# Patient Record
Sex: Female | Born: 1940
Health system: Southern US, Community
[De-identification: ages and names within clinical notes are randomized; demographics above are authoritative.]

## PROBLEM LIST (undated history)

## (undated) DIAGNOSIS — E782 Mixed hyperlipidemia: Secondary | ICD-10-CM

## (undated) DIAGNOSIS — M25569 Pain in unspecified knee: Secondary | ICD-10-CM

## (undated) DIAGNOSIS — M542 Cervicalgia: Secondary | ICD-10-CM

## (undated) DIAGNOSIS — Z860100 Personal history of colon polyps, unspecified: Secondary | ICD-10-CM

## (undated) DIAGNOSIS — I10 Essential (primary) hypertension: Secondary | ICD-10-CM

## (undated) DIAGNOSIS — J309 Allergic rhinitis, unspecified: Secondary | ICD-10-CM

## (undated) DIAGNOSIS — H9311 Tinnitus, right ear: Secondary | ICD-10-CM

## (undated) DIAGNOSIS — M62838 Other muscle spasm: Secondary | ICD-10-CM

## (undated) DIAGNOSIS — I471 Supraventricular tachycardia, unspecified: Secondary | ICD-10-CM

## (undated) DIAGNOSIS — M25549 Pain in joints of unspecified hand: Secondary | ICD-10-CM

## (undated) DIAGNOSIS — K219 Gastro-esophageal reflux disease without esophagitis: Secondary | ICD-10-CM

## (undated) DIAGNOSIS — D869 Sarcoidosis, unspecified: Secondary | ICD-10-CM

## (undated) DIAGNOSIS — E7800 Pure hypercholesterolemia, unspecified: Secondary | ICD-10-CM

## (undated) DIAGNOSIS — M899 Disorder of bone, unspecified: Secondary | ICD-10-CM

## (undated) DIAGNOSIS — E119 Type 2 diabetes mellitus without complications: Secondary | ICD-10-CM

## (undated) DIAGNOSIS — M8580 Other specified disorders of bone density and structure, unspecified site: Secondary | ICD-10-CM

## (undated) DIAGNOSIS — H269 Unspecified cataract: Secondary | ICD-10-CM

## (undated) DIAGNOSIS — R252 Cramp and spasm: Secondary | ICD-10-CM

## (undated) HISTORY — PX: ABDOMINAL HYSTERECTOMY: SHX81

## (undated) HISTORY — DX: Pain in joints of unspecified hand: M25.549

## (undated) HISTORY — PX: ROTATOR CUFF REPAIR: SHX139

## (undated) HISTORY — DX: Personal history of colon polyps, unspecified: Z86.0100

## (undated) HISTORY — DX: Cramp and spasm: R25.2

## (undated) HISTORY — DX: Personal history of colonic polyps: Z86.010

## (undated) HISTORY — DX: Other muscle spasm: M62.838

## (undated) HISTORY — DX: Allergic rhinitis, unspecified: J30.9

## (undated) HISTORY — PX: TRIGGER FINGER RELEASE: SHX641

## (undated) HISTORY — DX: Pure hypercholesterolemia, unspecified: E78.00

## (undated) HISTORY — DX: Other specified disorders of bone density and structure, unspecified site: M85.80

## (undated) HISTORY — DX: Mixed hyperlipidemia: E78.2

## (undated) HISTORY — DX: Supraventricular tachycardia, unspecified: I47.10

## (undated) HISTORY — DX: Pain in unspecified knee: M25.569

## (undated) HISTORY — DX: Disorder of bone, unspecified: M89.9

## (undated) HISTORY — DX: Supraventricular tachycardia: I47.1

## (undated) HISTORY — DX: Tinnitus, right ear: H93.11

## (undated) HISTORY — DX: Unspecified cataract: H26.9

## (undated) HISTORY — DX: Gastro-esophageal reflux disease without esophagitis: K21.9

## (undated) HISTORY — DX: Cervicalgia: M54.2

---

## 1999-02-10 ENCOUNTER — Other Ambulatory Visit: Admission: RE | Admit: 1999-02-10 | Discharge: 1999-02-10 | Payer: Self-pay | Admitting: Obstetrics and Gynecology

## 1999-04-19 ENCOUNTER — Ambulatory Visit (HOSPITAL_COMMUNITY): Admission: RE | Admit: 1999-04-19 | Discharge: 1999-04-20 | Payer: Self-pay | Admitting: Neurosurgery

## 1999-04-28 ENCOUNTER — Ambulatory Visit (HOSPITAL_COMMUNITY): Admission: RE | Admit: 1999-04-28 | Discharge: 1999-04-28 | Payer: Self-pay | Admitting: Neurosurgery

## 1999-05-10 ENCOUNTER — Encounter: Admission: RE | Admit: 1999-05-10 | Discharge: 1999-05-10 | Payer: Self-pay | Admitting: Neurosurgery

## 1999-06-17 ENCOUNTER — Encounter: Admission: RE | Admit: 1999-06-17 | Discharge: 1999-06-17 | Payer: Self-pay | Admitting: Neurosurgery

## 1999-09-03 ENCOUNTER — Encounter: Admission: RE | Admit: 1999-09-03 | Discharge: 1999-09-03 | Payer: Self-pay | Admitting: *Deleted

## 1999-09-03 ENCOUNTER — Encounter: Payer: Self-pay | Admitting: *Deleted

## 1999-09-15 ENCOUNTER — Encounter: Admission: RE | Admit: 1999-09-15 | Discharge: 1999-09-15 | Payer: Self-pay | Admitting: Neurosurgery

## 2000-12-06 ENCOUNTER — Encounter: Admission: RE | Admit: 2000-12-06 | Discharge: 2000-12-06 | Payer: Self-pay | Admitting: Neurosurgery

## 2001-04-12 ENCOUNTER — Encounter: Admission: RE | Admit: 2001-04-12 | Discharge: 2001-04-12 | Payer: Self-pay | Admitting: Internal Medicine

## 2001-04-12 ENCOUNTER — Encounter: Payer: Self-pay | Admitting: Internal Medicine

## 2001-06-26 ENCOUNTER — Encounter: Payer: Self-pay | Admitting: Internal Medicine

## 2001-06-26 ENCOUNTER — Encounter: Admission: RE | Admit: 2001-06-26 | Discharge: 2001-06-26 | Payer: Self-pay | Admitting: Internal Medicine

## 2001-10-23 ENCOUNTER — Encounter: Payer: Self-pay | Admitting: Orthopedic Surgery

## 2001-10-23 ENCOUNTER — Ambulatory Visit (HOSPITAL_COMMUNITY): Admission: RE | Admit: 2001-10-23 | Discharge: 2001-10-23 | Payer: Self-pay | Admitting: Orthopedic Surgery

## 2001-12-24 ENCOUNTER — Encounter: Payer: Self-pay | Admitting: Orthopedic Surgery

## 2001-12-24 ENCOUNTER — Ambulatory Visit (HOSPITAL_COMMUNITY): Admission: RE | Admit: 2001-12-24 | Discharge: 2001-12-24 | Payer: Self-pay | Admitting: Orthopedic Surgery

## 2002-02-12 ENCOUNTER — Ambulatory Visit (HOSPITAL_BASED_OUTPATIENT_CLINIC_OR_DEPARTMENT_OTHER): Admission: RE | Admit: 2002-02-12 | Discharge: 2002-02-12 | Payer: Self-pay | Admitting: Orthopedic Surgery

## 2003-11-25 ENCOUNTER — Ambulatory Visit (HOSPITAL_COMMUNITY): Admission: RE | Admit: 2003-11-25 | Discharge: 2003-11-25 | Payer: Self-pay | Admitting: Orthopedic Surgery

## 2003-11-25 ENCOUNTER — Ambulatory Visit (HOSPITAL_BASED_OUTPATIENT_CLINIC_OR_DEPARTMENT_OTHER): Admission: RE | Admit: 2003-11-25 | Discharge: 2003-11-25 | Payer: Self-pay | Admitting: Orthopedic Surgery

## 2004-08-10 ENCOUNTER — Encounter: Admission: RE | Admit: 2004-08-10 | Discharge: 2004-08-10 | Payer: Self-pay | Admitting: Internal Medicine

## 2004-08-11 ENCOUNTER — Other Ambulatory Visit: Admission: RE | Admit: 2004-08-11 | Discharge: 2004-08-11 | Payer: Self-pay | Admitting: Obstetrics and Gynecology

## 2004-10-06 ENCOUNTER — Encounter: Admission: RE | Admit: 2004-10-06 | Discharge: 2004-10-06 | Payer: Self-pay | Admitting: Internal Medicine

## 2006-01-19 ENCOUNTER — Ambulatory Visit (HOSPITAL_BASED_OUTPATIENT_CLINIC_OR_DEPARTMENT_OTHER): Admission: RE | Admit: 2006-01-19 | Discharge: 2006-01-19 | Payer: Self-pay | Admitting: Orthopedic Surgery

## 2006-05-23 ENCOUNTER — Encounter (INDEPENDENT_AMBULATORY_CARE_PROVIDER_SITE_OTHER): Payer: Self-pay | Admitting: *Deleted

## 2006-05-23 ENCOUNTER — Ambulatory Visit (HOSPITAL_BASED_OUTPATIENT_CLINIC_OR_DEPARTMENT_OTHER): Admission: RE | Admit: 2006-05-23 | Discharge: 2006-05-23 | Payer: Self-pay | Admitting: Orthopedic Surgery

## 2006-07-13 ENCOUNTER — Other Ambulatory Visit: Admission: RE | Admit: 2006-07-13 | Discharge: 2006-07-13 | Payer: Self-pay | Admitting: Obstetrics and Gynecology

## 2008-08-12 ENCOUNTER — Encounter: Admission: RE | Admit: 2008-08-12 | Discharge: 2008-08-12 | Payer: Self-pay | Admitting: Internal Medicine

## 2008-08-28 ENCOUNTER — Encounter: Payer: Self-pay | Admitting: Obstetrics and Gynecology

## 2008-08-28 ENCOUNTER — Other Ambulatory Visit: Admission: RE | Admit: 2008-08-28 | Discharge: 2008-08-28 | Payer: Self-pay | Admitting: Obstetrics and Gynecology

## 2008-08-28 ENCOUNTER — Ambulatory Visit: Payer: Self-pay | Admitting: Obstetrics and Gynecology

## 2008-09-09 ENCOUNTER — Ambulatory Visit: Payer: Self-pay | Admitting: Obstetrics and Gynecology

## 2009-11-17 ENCOUNTER — Ambulatory Visit (HOSPITAL_BASED_OUTPATIENT_CLINIC_OR_DEPARTMENT_OTHER): Admission: RE | Admit: 2009-11-17 | Discharge: 2009-11-17 | Payer: Self-pay | Admitting: Orthopedic Surgery

## 2010-02-14 ENCOUNTER — Emergency Department (HOSPITAL_BASED_OUTPATIENT_CLINIC_OR_DEPARTMENT_OTHER): Admission: EM | Admit: 2010-02-14 | Discharge: 2010-02-14 | Payer: Self-pay | Admitting: Emergency Medicine

## 2010-10-19 LAB — POCT HEMOGLOBIN-HEMACUE: Hemoglobin: 13.9 g/dL (ref 12.0–15.0)

## 2010-10-19 LAB — BASIC METABOLIC PANEL
BUN: 12 mg/dL (ref 6–23)
CO2: 28 mEq/L (ref 19–32)
Calcium: 8.9 mg/dL (ref 8.4–10.5)
Chloride: 104 mEq/L (ref 96–112)
Creatinine, Ser: 0.92 mg/dL (ref 0.4–1.2)
GFR calc Af Amer: >60 mL/min (ref >60)
GFR calc non Af Amer: >60 mL/min (ref >60)
Glucose, Bld: 117 mg/dL — ABNORMAL HIGH (ref 70–99)
Potassium: 3.7 mEq/L (ref 3.5–5.1)
Sodium: 140 mEq/L (ref 135–145)

## 2010-10-19 LAB — GLUCOSE, CAPILLARY
Glucose-Capillary: 112 mg/dL — ABNORMAL HIGH (ref 70–99)
Glucose-Capillary: 86 mg/dL (ref 70–99)

## 2010-12-17 NOTE — Op Note (Signed)
[name], [name]              ACCOUNT NO.:  [id number]   MEDICAL RECORD NO.:  [id number]          PATIENT TYPE:  AMB   LOCATION:  DSC                          FACILITY:  [facility]   PHYSICIAN:  Katy Fitch. Sypher, M.D. DATE OF BIRTH:  12/14/1940   DATE OF PROCEDURE:  05/23/2006  DATE OF DISCHARGE:                                 OPERATIVE REPORT   PREOPERATIVE DIAGNOSIS:  Complex chronic tenosynovitis left ring finger  status post release of A1 pulley 4 months prior with a residual crepitation  and locking of profundus tendon.   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   OPERATION:  Palmar fasciectomy, left ring finger, followed by  tenosynovectomy of the superficialis and profundus flexor tendons left ring  finger.   OPERATIONS:  [name], M.D.   ASSISTANT:  Renee Maduro Dasnoit PA-C   ANESTHESIA:  Is 2% lidocaine palmar block supplemented by IV sedation,  supervising anesthesiologist is [name].   INDICATIONS:  [name] is a 70 year old woman who is employed as a  Occupational psychologist for [employer].   I have known her for more than 10 years.  She has had a history of stenosing  tenosynovitis and other orthopedic predicaments affecting her upper  extremities.   Four months prior she underwent an uncomplicated release of her left ring  finger A1 pulley.   She had a 49-month history of stenosing tenosynovitis prior to the surgery  and had a very prolonged approvals process to obtain her surgical  __________.   At the time of her prior surgery.  A simple release of her A1 pulley was  accomplished.   Initial 2 months following surgery she had recovery full range of motion,  however, approximately 2 months after surgery she began to  experience  recurrent pain in her palm and had a crepitation with full flexion of the  finger.  She developed a recurrent locking.  Her differential diagnosis  included palmar fibromatosis causing recurrent stenosing tenosynovitis first  as partial flexor tendon injury versus a nodule in the tendon.  Due to  failure to respond after 2 months of nonoperative management, she is brought  to the operating room at this time for re-exploration of her flexor sheath.   PROCEDURE:  [name] is brought to operating room and placed supine  position on the table.   After anesthesia consult by [name], monitored anesthesia care was  advised.  [name] was transferred to room 6, placed in supine position on  the table and IV sedation provided.  The left arm was prepped with Betadine  soap solution, sterilely draped.  Pneumatic tourniquet applied to proximal  left brachium.   2% lidocaine was infiltrated along the path the common digital nerves to the  ring finger.  When anesthesia satisfactory the arm was scrubbed with  Betadine soap solution, sterilely draped.  After exsanguination of the left  arm with  Esmarch bandage, arterial tourniquet inflated to 240 mmHg.   Procedure commenced with a Brunner zigzag incision exposing the flexor  sheath from approximately 1 cm above the A1 pulley to the base of the A2  pulley distally.   The subcutaneous issues were carefully divided revealing a mass of keloid  type scar involving the pre tendinous fibers of the ring finger and the  adjacent septa.   With great care the common digital artery and nerve to the long and ring  finger and ring and small finger were dissected from this fibrous mass and a  complete fasciectomy accomplished.  The flexor sheath was studied.  The A1  pulley had partially reformed but was not particularly tense.  The reformed  A1 pulley was incised and the flexor tendons delivered.  There was a  considerable cuff of  tenosynovium that developed that was quite fibrotic  between the superficialis and profundus tendons.   The tendons were individually delivered and a complete tenosynovectomy was  accomplished.   Thereafter free range of motion the finger was recovered.   [name] was awakened from sedation and demonstrated full active range of  motion without residual crepitation or triggering.  There was no sign of  flexor tendon injury.  It appeared that the primary pathology was a palmar  fibromatosis, secondary pathology is secondary tenosynovitis.   The wound was then repaired with intradermal 3-0 Prolene suture.  A  compressive dressing was applied with Xeroflo, sterile gauze, and Ace wrap.   For aftercare [name] was advised to elevate her hand for 4 days.  She  will begin immediate range of motion exercises.  She will be discharged with  prescription for Vicodin 5 mg one p.o. q.4-6 hours p.r.n. pain 20 tablets  without refill.  She return to office for follow-up in 5 days for dressing  change and suture removal in approximately 8-9 days.      Katy Fitch Sypher, M.D.  Electronically Signed     RVS/MEDQ  D:  05/23/2006  T:  05/24/2006  Job:  [id number]   cc:   Ladell Pier, M.D.

## 2010-12-17 NOTE — Op Note (Signed)
[facility]  Patient:    AKELIA, HUSTED Visit Number: [id number] MRN: [id number]          Service Type: DSU Location: [facility] Attending Physician:  Susa Day Dictated by:   Katy Fitch Naaman Plummer., M.D. Proc. Date: 02/12/02 Admit Date:  02/12/2002   CC:         Barbette Hair. Vaughan Basta., M.D.  Stacie Glaze, M.D. [facility]  Hewitt Shorts, M.D.   Operative Report  PREOPERATIVE DIAGNOSIS:  Chronic left arm and wrist pain dating back to February 2003, status post extensive evaluation by Drs. Farris Has of sports medicine, Dr. Newell Coral neurosurgery, Dr. Coral Spikes rheumatology and the upper extremity physicians at the [facility] including myself and Dr. Johna Roles.  PREOPERATIVE DIAGNOSIS:  Probable synovitis of left ulnocarpal joint with positive bone scan revealing increased uptake in the region of the distal radioulnar joint and ulnocarpal joint, rule out triangular fibrocartilage tear versus inflammatory arthropathy.  POSTOPERATIVE DIAGNOSIS:  Moderate synovitis without evidence of triangular fibrocartilage tear.  OPERATION PERFORMED:  Diagnostic arthroscopy of the left radiocarpal and ulnocarpal joints with ulnocarpal and dorsal radiocarpal synovectomy.  SURGEON:  Katy Fitch. Sypher, Montez Hageman., M.D.  ASSISTANT:  Jonni Sanger, P.A.  ANESTHESIA:  Axillary block.  SUPERVISING ANESTHESIOLOGIST:  Dr. Gypsy Balsam.  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  She presented for evaluation and management of a painful left upper extremity in February 2003.  She is a long term patient of our office who has had previous carpal tunnel release in 1994.  She was initially evaluated by Dr. Farris Has at [facility] for pain on the dorsal aspect and ulnar aspect of her left wrist.  Dr. Farris Has felt that she probably had  a tenosynovitis and placed her in a wrist splint on Celebrex.  She was referred to see Lillia Dallas. Murray Hodgkins, M.D. for electrodiagnostic studies.  She was noted to have prolonged latencies on the left; however, she is status post left carpal tunnel release in 1994 and in my judgment I believe that these were chronic changes following her previous entrapment neuropathy.  At the time of her evaluation in February 2003, my impression was that she likely had had a left fourth dorsal compartment stenosing synovitis and was advised to continue to splint and use Celebrex.  She returned in March 2003 with persistent discomfort despite splinting and anti-inflammatory medication.  She began to experience numbness in her ulnar enervated fingers.  She was seen by Dr. Johna Roles for detailed electrodiagnostic studies.  These were completed on October 01, 2001.  She was noted to have normal bilateral ulnar sensory studies with normal amplitudes and F-waves.  We were unable to make a diagnosis of entrapment neuropathy of the ulnar nerve.  We continued to treat her symptomatically and requested an evaluation by her neurosurgical physicians including an MRI of her cervical spine.  The plain films of her cervical spine obtained in our office revealed a satisfactory C5-6 anterior cervical fusion completed by Dr. Lovell Sheehan 2000 with an anterior plate fixation system.  Her fusion appeared to be solid.  She had significant C4-5 degenerative disk disease with uncovertebral hypertrophy and foraminal compromise.  An MRI of her cervical spine was obtained on October 23, 2001 and was interpreted by the radiologist to reveal satisfactory fusion of the C5-6 disk, no abnormality at the C7-T1 that would  account for possible C8 root symptoms on the left.  She had moderate spondylosis at C4-5 with osteophyte encroachment upon the ventral subarachnoid space and both neural foramina at C4-5.  There was left-sided uncovertebral  disease at C2-3 and C3-4 with some minimal foraminal narrowing.  There was minimal spondylosis at C6-7 without stenosis.  The MRI was nondiagnostic for a source of left arm pain.  We continued to treat Ms. Decarolis through April and May with Neurontin as well as other medications to try to manage her pain.  I requested that she return to see Dr. Coral Spikes at [facility] for rheumatology consultation.  Dr. Coral Spikes completed a comprehensive evaluation on Dec 25, 2001.  Ms. Chiles had a history of sarcoidosis and Dr. Fayrene Fearing evaluation and management was designed to rule out an inflammatory source and/or a sarcoidosis source of her continued arm pain.  An extremely thorough evaluation by Dr. Coral Spikes revealed "left outer wrist pain radiating up left forearm, question etiology."  Dr. Coral Spikes could not identify a rheumatologic source of her discomfort and did not believe that her sarcoidosis was an issue in her pain at this time.  Dr. Coral Spikes returned her to the [facility] for further evaluation of her wrist predicament.  Clinical examination began to reveal more focused symptoms on the ulnar aspect of her wrist.  She was referred for a bone scan of her left wrist on Dec 24, 2001, which was interpreted by Dr. Kearney Hard, radiologist, to reveal increased activity in the region of the left distal radioulnar joint on both the blood pool and delayed images consistent with triangular fibrocartilage tear and reactive changes in the region of the distal radioulnar joint and ulnocarpal joint.  After two more months of observation, Ms. Grandmaison continued to experience ulnar sided wrist pain and crepitation with translation of her ulnocarpal joint. Therefore, we recommended proceeding with diagnostic arthroscopy of her wrist at this time.   In brief summary, Ms. Talmadge has had a relatively relentless pain predicament in her left arm that has defied neurosurgical, orthopedic and  rheumatological explanation todate.  The goal of this procedure was to identify a source of her pain.  DESCRIPTION OF PROCEDURE:  Maelynn Moroney was brought to the operating room and placed in supine position on the operating table.  Following axillary block in the holding area, anesthesia was satisfactory in the left arm.  The left arm was prepped with Betadine soap and solution and sterilely draped. One gram of Ancef was administered as an IV prophylactic antibiotic.  Ten pounds of traction was applied to the left wrist with finger traps on the index and long fingers and counter traction on the forearm.  The arthroscope was introduced after distention of the wrist joint with 5 cc of sterile saline with blunt technique through a 3-4 dorsal portal. Diagnostic arthroscopy revealed abundant soft tissue on the dorsal aspect of the radiolunate articulation which obscured easy visualization of the triangular fibrocartilage.  The scope was carefully advanced to the region of the triangular fibrocartilage.  The hyaline articular cartilage on the triquetrum, lunate and scaphoid was normal.  The lunatotriquetral interosseous ligament and the scapholunate interosseous ligament were both noted to be normal.  The triangular fibrocartilage was intact.  There was 2+ synovitis at the ulnocarpal recess.  The pisotriquetral joint was not visualized.  There was a normal prestyloid recess.  A 6-R portal was created with an 18 gauge needle followed by use of a hemostat to enlarge the portal to  accept a 2.9 mm suction shaver.  A full radius resector was used to perform a synovectomy in the region of the prestyloid recess.  No other pathology was identified.  No loose bodies were noted. There was no sign of a peripheral or central triangular fibrocartilage tear.  The scope was then removed from the 3-4 dorsal portal, placed in the 6-R portal and used to visualize in a radial direction.  There was  abundant synovitis noted on the dorsal aspect of the lunate and some capsular thickening.  A synovectomy was performed on the dorsal aspect of the LT ligament, lunate and across the dorsal surface of the proximal pole of the scaphoid.  I could not identify an interosseous or intracapsular ganglion and I did not find evidence of significant radiocarpal pathology.  The ligamentous structures visualized appeared normal.  After completion of the synovectomy I elected to discontinue the arthroscopy.  The plain films did not suggest any midcarpal pathology and clinical exam suggested no midcarpal pathology.  In my experience there are more complications of midcarpal arthroscopy than would warrant in my judgment arthroscopy of this joint at this time; therefore, this was not included in this diagnostic procedure.  The arthroscopic equipment was removed and the portals repaired with interrupted sutures of 5-0 nylon.  My final diagnostic impression was that there was moderate synovitis within the wrist joint deep to the fourth dorsal compartment and this could be a response to repetitive work and may be the source of Ms. Schippers pain.  There were no signs of significant internal derangement other than the synovitis.  For aftercare, Ms. Kott was placed in a well-padded volar splint with the wrist in 5 degrees dorsiflexion.  Ace wrap was used for light compression. She was awakened from sedation and transferred to recovery room with stable vital signs.  She will return to our office for follow-up evaluation in five days to begin a rehabilitation program.  For aftercare she is given prescriptions for Percocet 5 mg one to two tablets p.o. q.4-6h. p.r.n. pain, 20 tablets without refill. Also Keflex 500 mg 1 p.o. q.8h. times four days as a prophylactic antibiotic. Dictated by:   Katy Fitch Naaman Plummer., M.D. Attending Physician:  Susa Day DD:  02/12/02 TD:  02/13/02 Job:  [id number]

## 2010-12-17 NOTE — Op Note (Signed)
NAME:  [name], [name]                        ACCOUNT NO.:  [id number]   MEDICAL RECORD NO.:  [id number]                   PATIENT TYPE:  AMB   LOCATION:  DSC                                  FACILITY:  [facility]   PHYSICIAN:  Katy Fitch. Naaman Plummer., M.D.          DATE OF BIRTH:  01-Jul-1941   DATE OF PROCEDURE:  11/25/2003  DATE OF DISCHARGE:                                 OPERATIVE REPORT   PREOPERATIVE DIAGNOSIS:  Chronic painful stenosing tenosynovitis, right ring  finger at A1 pulley.   POSTOPERATIVE DIAGNOSIS:  Chronic painful stenosing tenosynovitis, right  ring finger at A1 pulley.   PROCEDURE:  Release of right ring finger A1 pulley and careful inspection of  flexor tendons.  No synovial biopsy was obtained as there was no significant  tenosynovitis noted.   SURGEON:  Katy Fitch. Sypher, M.D.   ASSISTANT:  Jonni Sanger, P.A.   ANESTHESIA:  General by LMA.   SUPERVISING ANESTHESIOLOGIST:  Janetta Hora. Gelene Mink, M.D.   INDICATIONS FOR PROCEDURE:  The patient is a 69 year old woman referred by  Dr. Lennox Pippins for evaluation and management of a painful locking right  ring finger.   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   She filed a Facilities manager and was ultimately successful  achieving benefits for this predicament.   Due to a failure to respond to nonoperative measures she is brought to the  operating room at this time for release of her right ring finger A1 pulley.   Due to her history of sarcoidosis, we were anticipating a possible synovial  biopsy.   DESCRIPTION OF PROCEDURE:  The patient is brought to the operating room and  placed in the supine position on the operating table.  Following induction  of general anesthesia by LMA, the right arm was prepped with Betadine soap  and solution and sterilely draped.   Following exsanguination of the limb with an  Esmarch bandage, an arterial  tourniquet on the proximal brachium was inflated to 220 mmHg.  The procedure  commenced with a short oblique incision directly over the A1 pulley.  The  subcutaneous tissues were carefully divided revealing the flexor sheath.  The neurovascular bundles were gently retracted.  The sheath was not  distended; rather the pulley appeared to be contracted.   The A1 pulley was released along its radial border followed by delivery of  the flexor tendons.  There were some steroid crystal residuals within the  superficialis tendon.  There was no sign of significant tendosynovitis.   There was no significant A0 pulley.   Therefore, free range of motion of the finger was recovered.   The wound was then irrigated and repaired with interrupted suture of 5-0  nylon.   For after care, the patient is given a prescription for Darvocet-N 100 one  p.o. q.4  to 6h p.r.n. pain, #20 tablets without refill.                                               Katy Fitch Naaman Plummer., M.D.    RVS/MEDQ  D:  11/25/2003  T:  11/25/2003  Job:  [id number]   cc:   Demetria Pore. Coral Spikes, M.D.  [address]  [address]  [place]  [place] [address]  Fax: [phone]

## 2010-12-17 NOTE — Op Note (Signed)
[name], VENEZIA              ACCOUNT NO.:  [id number]   MEDICAL RECORD NO.:  [id number]          PATIENT TYPE:  AMB   LOCATION:  DSC                          FACILITY:  [facility]   PHYSICIAN:  Katy Fitch. Sypher, M.D. DATE OF BIRTH:  07-01-1941   DATE OF PROCEDURE:  01/19/2006  DATE OF DISCHARGE:                                 OPERATIVE REPORT   PREOPERATIVE DIAGNOSIS:  Chronic stenosing tenosynovitis, left ring finger,  A1 pulley.   POSTOPERATIVE DIAGNOSIS:  Chronic stenosing tenosynovitis, left ring finger,  A1 pulley.   OPERATION:  Release of left ring finger, A1 pulley.   OPERATIONS:  Josephine Igo, M.D.   ASSISTANT:  Annye Rusk, P.A.-C.   ANESTHESIA:  Two percent lidocaine metacarpal head level block, left ring  finger, supplemented by IV sedation, supervising anesthesiologist Dr. Gypsy Balsam.   INDICATIONS:  Ms. Natahsa Marian is a 70 year old woman, self-referred for  evaluation management of a locking left ring trigger finger.  She has had  past experience of trigger fingers and carpal tunnel syndrome.  Having  failed prior injection therapy, she requested we proceed directly to release  of the A1 pulley.   After informed consent, she was brought to the operating room at this time.   PROCEDURE:  Ms. Kalliopi Coupland was brought to the operating room and placed  in the supine position on the table.   Following light sedation, left arm was prepped with Betadine soap solution  and sterilely draped.  Following examination of the limb with Esmarch  bandage, an arterial tourniquet on the proximal brachium was inflated to 220  mmHg.  Procedure commenced with short incision, directly over the palpably  thickened A1 pulley.  Subcutaneous tissues were carefully divided revealing  the palmar fascia.  This was split longitudinally to reveal the flexor  sheath.  The ____________ fibers of the ring finger were released with  scissors followed by isolation of A1 pulley.  The pulley  was released along  its radial border.  The tendons were delivered.  No masses or other  predicaments were noted.  This was small A0 pulley proximally that was  released with scissors as well.   The wound was then repaired with mattress suture of 5-0 nylon.   A compressive dressing was applied with Xeroflo sterile gauze and Ace wrap.  There were no apparent complications.      Katy Fitch Sypher, M.D.  Electronically Signed     RVS/MEDQ  D:  01/19/2006  T:  01/19/2006  Job:  [id number]   cc:   Ladell Pier, M.D.  Fax: [phone]

## 2011-03-31 ENCOUNTER — Encounter (HOSPITAL_BASED_OUTPATIENT_CLINIC_OR_DEPARTMENT_OTHER)
Admission: RE | Admit: 2011-03-31 | Discharge: 2011-03-31 | Disposition: A | Discharge status: Home / Self Care | Payer: Medicare Other | Source: Ambulatory Visit | Attending: Orthopedic Surgery | Admitting: Orthopedic Surgery

## 2011-03-31 LAB — BASIC METABOLIC PANEL
BUN: 17 mg/dL (ref 6–23)
Chloride: 103 mEq/L (ref 96–112)
Creatinine, Ser: 0.81 mg/dL (ref 0.50–1.10)
GFR calc Af Amer: >60 mL/min (ref >60)
GFR calc non Af Amer: >60 mL/min (ref >60)

## 2011-04-05 ENCOUNTER — Ambulatory Visit (HOSPITAL_BASED_OUTPATIENT_CLINIC_OR_DEPARTMENT_OTHER)
Admission: RE | Admit: 2011-04-05 | Discharge: 2011-04-05 | Disposition: A | Discharge status: Home / Self Care | Payer: Medicare Other | Source: Ambulatory Visit | Attending: Orthopedic Surgery | Admitting: Orthopedic Surgery

## 2011-04-05 DIAGNOSIS — I10 Essential (primary) hypertension: Secondary | ICD-10-CM | POA: Insufficient documentation

## 2011-04-05 DIAGNOSIS — E119 Type 2 diabetes mellitus without complications: Secondary | ICD-10-CM | POA: Insufficient documentation

## 2011-04-05 DIAGNOSIS — M75110 Incomplete rotator cuff tear or rupture of unspecified shoulder, not specified as traumatic: Secondary | ICD-10-CM | POA: Insufficient documentation

## 2011-04-05 DIAGNOSIS — Z5333 Arthroscopic surgical procedure converted to open procedure: Secondary | ICD-10-CM | POA: Insufficient documentation

## 2011-04-05 DIAGNOSIS — Z01810 Encounter for preprocedural cardiovascular examination: Secondary | ICD-10-CM | POA: Insufficient documentation

## 2011-04-05 DIAGNOSIS — M7530 Calcific tendinitis of unspecified shoulder: Secondary | ICD-10-CM | POA: Insufficient documentation

## 2011-04-05 DIAGNOSIS — K219 Gastro-esophageal reflux disease without esophagitis: Secondary | ICD-10-CM | POA: Insufficient documentation

## 2011-04-05 DIAGNOSIS — M19019 Primary osteoarthritis, unspecified shoulder: Secondary | ICD-10-CM | POA: Insufficient documentation

## 2011-04-05 LAB — GLUCOSE, CAPILLARY: Glucose-Capillary: 106 mg/dL — ABNORMAL HIGH (ref 70–99)

## 2011-04-14 NOTE — Op Note (Signed)
[name], [name]              ACCOUNT NO.:  [id number]  MEDICAL RECORD NO.:  [id number]  LOCATION:                                 FACILITY:  PHYSICIAN:  Katy Fitch. Hailea Eaglin, M.D. DATE OF BIRTH:  [birth year]  DATE OF PROCEDURE:  04/05/2011 DATE OF DISCHARGE:                              OPERATIVE REPORT   PREOPERATIVE DIAGNOSES: 1. Chronic acromioclavicular degenerative arthritis, right shoulder. 2. Calcific tendinopathy, right shoulder. 3. MRI documented, near complete rotator cuff tear of supraspinatus     thought to be a 80% plus partial articular surface tendon avulsion     lesion.  POSTOPERATIVE DIAGNOSES:  68% plus partial articular surface tendon avulsion lesion of supraspinatus rotator cuff tendon, unfavorable acromioclavicular anatomy and unfavorable acromial anatomy.  OPERATIONS: 1. Examination of right shoulder under anesthesia confirming the     absence of adhesive capsulitis and a stable glenohumeral joint. 2. Diagnostic arthroscopy, right glenohumeral joint confirming 90%     partial articular surface tendon avulsion tear of supraspinatus     rotator cuff tendon. 3. Arthroscopic subacromial decompression with acromioplasty,     coracoacromial ligament release and bursectomy. 4. Arthroscopic distal clavicle resection. 5. Open reconstruction of supraspinatus rotator cuff tendon.  OPERATING SURGEON:  Katy Fitch. Cristabel Bicknell, MD  ASSISTANT:  Jonni Sanger, PA-C  ANESTHESIA:  General endotracheal.  SUPERVISING ANESTHESIOLOGIST:  Achille Rich, MD  Ms. Helminiak also had a supplemental ropivacaine plexus block for perioperative analgesia.  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  We have been acquainted in a working relationship for more than 10 years.  We have treated her for a number orthopedic predicaments of the upper extremity.  In the late spring of 2012, she presented for evaluation of  significant right shoulder pain. Exam was compatible with impingement, AC arthropathy, and a probable rotator cuff tear.  Eventually, we obtained an MRI of her shoulder, which documented a significant PASTA articular surface tear of the supraspinous rotator cuff tendon as well as unfavorable AC anatomy and an unfavorable acromial anatomy.  We advised Ms. Asano when her pain became intolerable to proceed with diagnostic arthroscopy, subacromial decompression, distal clavicle resection, and repair of rotator cuff. She is brought to the operating room at this time anticipating decompression, distal clavicle resection, and repair of her supraspinatus rotator cuff tendon.  Preoperatively, she was interviewed by Dr. Chaney Malling of Anesthesia who recommended general anesthesia by endotracheal technique.  This was supplemented by perioperative plexus block, placed without complication in the holding area utilizing ropivacaine.  Questions were invited and answered in detail in the holding area.  PROCEDURE:  [name] is brought to room 2 of the [facility] and placed in supine position on the operating table.  Following the induction of general endotracheal anesthesia under Dr. Seward Meth direct supervision, she was carefully positioned in the beach- chair position with torso and headholder designed for shoulder arthroscopy.  Examination of the right shoulder under anesthesia revealed combined elevation 170, external rotation 90, internal rotation 70.  She had no sign of glenohumeral instability.  The right upper extremity was prepped with DuraPrep and draped with impervious arthroscopy drapes.  Passive compression devices were applied to the calves and 1 g of Ancef was administered as an IV prophylactic antibiotic.  Following a routine surgical time-out, we proceeded to instrument the shoulder with a switching stick placed anteriorly with through-and- through technique to  place the scope through a standard posterior viewing portal.  Diagnostic arthroscopy confirmed intact hyaline articular cartilage surfaces on the glenoid and humeral head.  The superior recesses and inferior recess were both noted be normal.  The long head of the biceps had a stable origin at the superior glenoid. The biceps was normal through the rotator interval.  The subscapularis was normal.  The supraspinatus had a 90% PASTA lesion with degenerative tendon exposing the greater tuberosity.  The infraspinatus and teres minor were normal.  After photographic documentation of the pathology and normal anatomy, we proceeded with instrumentation of the tear by placing a spinal needle and use of a 4.2-mm suction shaver to debride the tendon.  Given the complexity of repairing these well with arthroscopic technique, I ultimately elected to perform a hybrid open repair through a very small incision.  We then removed the arthroscope from the glenohumeral joint and placed in the subacromial space.  Florid bursitis was cleared with a suction shaver followed by release of coracoacromial ligament.  The acromion was leveled to a type 1 morphology with significant removal of an anterior osteophyte and lateral osteophyte.  The capsule of the [facility] joint was photographed.  The capsule remnants removed with suction shaver and bipolar cautery.  Hemostasis was achieved with bipolar cautery throughout the bursa.  The suction bur was then used to level the acromion at the [facility] joint and to perform further lateral and anterior acromioplasty.  The distal centimeter  of clavicle was removed arthroscopically with hemostasis with bipolar cautery.  The bursal side of the tendon was quite rough due to abrasion deep to the coracoacromial ligament and anterior osteophytes and the AC osteophytes.  We then noted the proper approach with a spinal needle to the repair site followed by performing a 2-cm incision with  anterior middle third deltoid splitting, bursectomy, incision through the bursal surface of the cuff confirming the 90% degenerative tear off of the greater tuberosity.  A power bur was used to decorticate the footprint of the supraspinatus followed by placement of a vented [employer] corkscrew medially followed by placement of two mattress sutures creating anatomic footprint at the articular margin.  The tails of the suture was used to invaginate the edges of the repair into an anatomic footprint followed by use of the lateral SwiveLock with all four suture tails compressing the repair in a most satisfactory manner.  The scope was  replaced in glenohumeral joint confirming that the long head of the biceps was not filed with the suture and the footprint of the repair was anatomic.  After irrigation of the joint and subacromial space, we repaired the muscle split with figure-of-eight mattress sutures of 0 Vicryl followed by repair of the skin with subcutaneous 0 Vicryl, 2-0 Vicryl, and intradermal 3-0 Prolene.  There  were no apparent complications.  Ms. Mohammad tolerated the surgery and anesthesia well.  She was placed in a sling, awakened from general anesthesia and transferred to the recovery room with stable vital signs.  For aftercare, she will be monitored in the recovery care center with serial examination of her cutaneous blood glucose and appropriate analgesics in the form of p.o. and IV Dilaudid.  She will be provided Ancef 1 g IV  q.8 h. x3 doses.     Katy Fitch Keondre Markson, M.D.     RVS/MEDQ  D:  04/05/2011  T:  04/06/2011  Job:  [id number]  cc:   Deirdre Peer. Polite, M.D.  Electronically Signed by Josephine Igo M.D. on 04/14/2011 08:40:54 AM

## 2011-08-03 DIAGNOSIS — M75120 Complete rotator cuff tear or rupture of unspecified shoulder, not specified as traumatic: Secondary | ICD-10-CM | POA: Diagnosis not present

## 2011-08-03 DIAGNOSIS — M67919 Unspecified disorder of synovium and tendon, unspecified shoulder: Secondary | ICD-10-CM | POA: Diagnosis not present

## 2011-08-03 DIAGNOSIS — M19019 Primary osteoarthritis, unspecified shoulder: Secondary | ICD-10-CM | POA: Diagnosis not present

## 2011-08-03 DIAGNOSIS — M719 Bursopathy, unspecified: Secondary | ICD-10-CM | POA: Diagnosis not present

## 2011-08-08 DIAGNOSIS — M75120 Complete rotator cuff tear or rupture of unspecified shoulder, not specified as traumatic: Secondary | ICD-10-CM | POA: Diagnosis not present

## 2011-08-08 DIAGNOSIS — M719 Bursopathy, unspecified: Secondary | ICD-10-CM | POA: Diagnosis not present

## 2011-08-08 DIAGNOSIS — M19019 Primary osteoarthritis, unspecified shoulder: Secondary | ICD-10-CM | POA: Diagnosis not present

## 2011-08-08 DIAGNOSIS — M67919 Unspecified disorder of synovium and tendon, unspecified shoulder: Secondary | ICD-10-CM | POA: Diagnosis not present

## 2011-08-09 DIAGNOSIS — M19019 Primary osteoarthritis, unspecified shoulder: Secondary | ICD-10-CM | POA: Diagnosis not present

## 2011-08-09 DIAGNOSIS — M67919 Unspecified disorder of synovium and tendon, unspecified shoulder: Secondary | ICD-10-CM | POA: Diagnosis not present

## 2011-08-09 DIAGNOSIS — M719 Bursopathy, unspecified: Secondary | ICD-10-CM | POA: Diagnosis not present

## 2011-08-09 DIAGNOSIS — M75120 Complete rotator cuff tear or rupture of unspecified shoulder, not specified as traumatic: Secondary | ICD-10-CM | POA: Diagnosis not present

## 2011-08-17 DIAGNOSIS — M719 Bursopathy, unspecified: Secondary | ICD-10-CM | POA: Diagnosis not present

## 2011-08-17 DIAGNOSIS — M67919 Unspecified disorder of synovium and tendon, unspecified shoulder: Secondary | ICD-10-CM | POA: Diagnosis not present

## 2011-08-17 DIAGNOSIS — M19019 Primary osteoarthritis, unspecified shoulder: Secondary | ICD-10-CM | POA: Diagnosis not present

## 2011-08-17 DIAGNOSIS — M75120 Complete rotator cuff tear or rupture of unspecified shoulder, not specified as traumatic: Secondary | ICD-10-CM | POA: Diagnosis not present

## 2011-08-23 DIAGNOSIS — M19019 Primary osteoarthritis, unspecified shoulder: Secondary | ICD-10-CM | POA: Diagnosis not present

## 2011-08-23 DIAGNOSIS — M75120 Complete rotator cuff tear or rupture of unspecified shoulder, not specified as traumatic: Secondary | ICD-10-CM | POA: Diagnosis not present

## 2011-08-23 DIAGNOSIS — M67919 Unspecified disorder of synovium and tendon, unspecified shoulder: Secondary | ICD-10-CM | POA: Diagnosis not present

## 2011-08-30 DIAGNOSIS — M19019 Primary osteoarthritis, unspecified shoulder: Secondary | ICD-10-CM | POA: Diagnosis not present

## 2011-08-30 DIAGNOSIS — M719 Bursopathy, unspecified: Secondary | ICD-10-CM | POA: Diagnosis not present

## 2011-08-30 DIAGNOSIS — M75120 Complete rotator cuff tear or rupture of unspecified shoulder, not specified as traumatic: Secondary | ICD-10-CM | POA: Diagnosis not present

## 2011-08-30 DIAGNOSIS — M67919 Unspecified disorder of synovium and tendon, unspecified shoulder: Secondary | ICD-10-CM | POA: Diagnosis not present

## 2011-09-05 DIAGNOSIS — M67919 Unspecified disorder of synovium and tendon, unspecified shoulder: Secondary | ICD-10-CM | POA: Diagnosis not present

## 2011-09-05 DIAGNOSIS — M75120 Complete rotator cuff tear or rupture of unspecified shoulder, not specified as traumatic: Secondary | ICD-10-CM | POA: Diagnosis not present

## 2011-09-05 DIAGNOSIS — M19019 Primary osteoarthritis, unspecified shoulder: Secondary | ICD-10-CM | POA: Diagnosis not present

## 2011-09-13 DIAGNOSIS — M542 Cervicalgia: Secondary | ICD-10-CM | POA: Diagnosis not present

## 2011-09-14 DIAGNOSIS — M75120 Complete rotator cuff tear or rupture of unspecified shoulder, not specified as traumatic: Secondary | ICD-10-CM | POA: Diagnosis not present

## 2011-09-14 DIAGNOSIS — M19019 Primary osteoarthritis, unspecified shoulder: Secondary | ICD-10-CM | POA: Diagnosis not present

## 2011-09-14 DIAGNOSIS — M719 Bursopathy, unspecified: Secondary | ICD-10-CM | POA: Diagnosis not present

## 2011-09-14 DIAGNOSIS — M67919 Unspecified disorder of synovium and tendon, unspecified shoulder: Secondary | ICD-10-CM | POA: Diagnosis not present

## 2011-09-20 DIAGNOSIS — M545 Low back pain, unspecified: Secondary | ICD-10-CM | POA: Diagnosis not present

## 2011-09-20 DIAGNOSIS — M25519 Pain in unspecified shoulder: Secondary | ICD-10-CM | POA: Diagnosis not present

## 2011-09-21 DIAGNOSIS — M67919 Unspecified disorder of synovium and tendon, unspecified shoulder: Secondary | ICD-10-CM | POA: Diagnosis not present

## 2011-09-21 DIAGNOSIS — M75120 Complete rotator cuff tear or rupture of unspecified shoulder, not specified as traumatic: Secondary | ICD-10-CM | POA: Diagnosis not present

## 2011-09-21 DIAGNOSIS — M719 Bursopathy, unspecified: Secondary | ICD-10-CM | POA: Diagnosis not present

## 2011-09-21 DIAGNOSIS — M19019 Primary osteoarthritis, unspecified shoulder: Secondary | ICD-10-CM | POA: Diagnosis not present

## 2011-09-27 DIAGNOSIS — M67919 Unspecified disorder of synovium and tendon, unspecified shoulder: Secondary | ICD-10-CM | POA: Diagnosis not present

## 2011-09-27 DIAGNOSIS — M75120 Complete rotator cuff tear or rupture of unspecified shoulder, not specified as traumatic: Secondary | ICD-10-CM | POA: Diagnosis not present

## 2011-09-27 DIAGNOSIS — M19019 Primary osteoarthritis, unspecified shoulder: Secondary | ICD-10-CM | POA: Diagnosis not present

## 2011-09-30 ENCOUNTER — Other Ambulatory Visit: Payer: Self-pay | Admitting: Neurosurgery

## 2011-09-30 DIAGNOSIS — M542 Cervicalgia: Secondary | ICD-10-CM

## 2011-10-04 DIAGNOSIS — M75120 Complete rotator cuff tear or rupture of unspecified shoulder, not specified as traumatic: Secondary | ICD-10-CM | POA: Diagnosis not present

## 2011-10-06 ENCOUNTER — Ambulatory Visit
Admission: RE | Admit: 2011-10-06 | Discharge: 2011-10-06 | Disposition: A | Discharge status: Home / Self Care | Payer: Medicare Other | Source: Ambulatory Visit | Attending: Neurosurgery | Admitting: Neurosurgery

## 2011-10-06 DIAGNOSIS — M5030 Other cervical disc degeneration, unspecified cervical region: Secondary | ICD-10-CM | POA: Diagnosis not present

## 2011-10-06 DIAGNOSIS — M47812 Spondylosis without myelopathy or radiculopathy, cervical region: Secondary | ICD-10-CM | POA: Diagnosis not present

## 2011-10-06 DIAGNOSIS — M542 Cervicalgia: Secondary | ICD-10-CM

## 2011-10-06 DIAGNOSIS — M5020 Other cervical disc displacement, unspecified cervical region: Secondary | ICD-10-CM | POA: Diagnosis not present

## 2011-10-07 DIAGNOSIS — E782 Mixed hyperlipidemia: Secondary | ICD-10-CM | POA: Diagnosis not present

## 2011-10-07 DIAGNOSIS — E1129 Type 2 diabetes mellitus with other diabetic kidney complication: Secondary | ICD-10-CM | POA: Diagnosis not present

## 2011-10-07 DIAGNOSIS — N182 Chronic kidney disease, stage 2 (mild): Secondary | ICD-10-CM | POA: Diagnosis not present

## 2011-10-07 DIAGNOSIS — I10 Essential (primary) hypertension: Secondary | ICD-10-CM | POA: Diagnosis not present

## 2011-10-11 DIAGNOSIS — M719 Bursopathy, unspecified: Secondary | ICD-10-CM | POA: Diagnosis not present

## 2011-10-11 DIAGNOSIS — M67919 Unspecified disorder of synovium and tendon, unspecified shoulder: Secondary | ICD-10-CM | POA: Diagnosis not present

## 2011-10-11 DIAGNOSIS — M75120 Complete rotator cuff tear or rupture of unspecified shoulder, not specified as traumatic: Secondary | ICD-10-CM | POA: Diagnosis not present

## 2011-10-17 DIAGNOSIS — M19019 Primary osteoarthritis, unspecified shoulder: Secondary | ICD-10-CM | POA: Diagnosis not present

## 2011-10-17 DIAGNOSIS — M24119 Other articular cartilage disorders, unspecified shoulder: Secondary | ICD-10-CM | POA: Diagnosis not present

## 2011-10-17 DIAGNOSIS — M67919 Unspecified disorder of synovium and tendon, unspecified shoulder: Secondary | ICD-10-CM | POA: Diagnosis not present

## 2011-10-24 DIAGNOSIS — M719 Bursopathy, unspecified: Secondary | ICD-10-CM | POA: Diagnosis not present

## 2011-10-24 DIAGNOSIS — M67919 Unspecified disorder of synovium and tendon, unspecified shoulder: Secondary | ICD-10-CM | POA: Diagnosis not present

## 2011-10-24 DIAGNOSIS — M19019 Primary osteoarthritis, unspecified shoulder: Secondary | ICD-10-CM | POA: Diagnosis not present

## 2011-11-01 DIAGNOSIS — M24119 Other articular cartilage disorders, unspecified shoulder: Secondary | ICD-10-CM | POA: Diagnosis not present

## 2011-11-01 DIAGNOSIS — M719 Bursopathy, unspecified: Secondary | ICD-10-CM | POA: Diagnosis not present

## 2011-11-01 DIAGNOSIS — M19019 Primary osteoarthritis, unspecified shoulder: Secondary | ICD-10-CM | POA: Diagnosis not present

## 2011-11-01 DIAGNOSIS — M67919 Unspecified disorder of synovium and tendon, unspecified shoulder: Secondary | ICD-10-CM | POA: Diagnosis not present

## 2011-11-07 DIAGNOSIS — R21 Rash and other nonspecific skin eruption: Secondary | ICD-10-CM | POA: Diagnosis not present

## 2011-11-07 DIAGNOSIS — E782 Mixed hyperlipidemia: Secondary | ICD-10-CM | POA: Diagnosis not present

## 2011-11-08 DIAGNOSIS — M719 Bursopathy, unspecified: Secondary | ICD-10-CM | POA: Diagnosis not present

## 2011-11-08 DIAGNOSIS — M19019 Primary osteoarthritis, unspecified shoulder: Secondary | ICD-10-CM | POA: Diagnosis not present

## 2011-11-08 DIAGNOSIS — M24119 Other articular cartilage disorders, unspecified shoulder: Secondary | ICD-10-CM | POA: Diagnosis not present

## 2011-11-08 DIAGNOSIS — M67919 Unspecified disorder of synovium and tendon, unspecified shoulder: Secondary | ICD-10-CM | POA: Diagnosis not present

## 2011-11-14 DIAGNOSIS — M67919 Unspecified disorder of synovium and tendon, unspecified shoulder: Secondary | ICD-10-CM | POA: Diagnosis not present

## 2011-11-14 DIAGNOSIS — M719 Bursopathy, unspecified: Secondary | ICD-10-CM | POA: Diagnosis not present

## 2011-11-30 ENCOUNTER — Encounter (INDEPENDENT_AMBULATORY_CARE_PROVIDER_SITE_OTHER): Payer: Medicare Other | Admitting: Ophthalmology

## 2011-11-30 DIAGNOSIS — E11319 Type 2 diabetes mellitus with unspecified diabetic retinopathy without macular edema: Secondary | ICD-10-CM

## 2011-11-30 DIAGNOSIS — H35039 Hypertensive retinopathy, unspecified eye: Secondary | ICD-10-CM | POA: Diagnosis not present

## 2011-11-30 DIAGNOSIS — H4310 Vitreous hemorrhage, unspecified eye: Secondary | ICD-10-CM | POA: Diagnosis not present

## 2011-11-30 DIAGNOSIS — E1165 Type 2 diabetes mellitus with hyperglycemia: Secondary | ICD-10-CM

## 2011-11-30 DIAGNOSIS — H3560 Retinal hemorrhage, unspecified eye: Secondary | ICD-10-CM | POA: Diagnosis not present

## 2011-11-30 DIAGNOSIS — I10 Essential (primary) hypertension: Secondary | ICD-10-CM

## 2011-11-30 DIAGNOSIS — H43819 Vitreous degeneration, unspecified eye: Secondary | ICD-10-CM | POA: Diagnosis not present

## 2011-11-30 DIAGNOSIS — E1139 Type 2 diabetes mellitus with other diabetic ophthalmic complication: Secondary | ICD-10-CM

## 2011-12-02 DIAGNOSIS — M542 Cervicalgia: Secondary | ICD-10-CM | POA: Diagnosis not present

## 2011-12-02 DIAGNOSIS — M545 Low back pain, unspecified: Secondary | ICD-10-CM | POA: Diagnosis not present

## 2012-01-09 DIAGNOSIS — H2510 Age-related nuclear cataract, unspecified eye: Secondary | ICD-10-CM | POA: Diagnosis not present

## 2012-01-11 ENCOUNTER — Encounter (INDEPENDENT_AMBULATORY_CARE_PROVIDER_SITE_OTHER): Payer: Medicare Other | Admitting: Ophthalmology

## 2012-01-11 DIAGNOSIS — H43819 Vitreous degeneration, unspecified eye: Secondary | ICD-10-CM | POA: Diagnosis not present

## 2012-01-11 DIAGNOSIS — H35039 Hypertensive retinopathy, unspecified eye: Secondary | ICD-10-CM

## 2012-01-11 DIAGNOSIS — E11319 Type 2 diabetes mellitus with unspecified diabetic retinopathy without macular edema: Secondary | ICD-10-CM

## 2012-01-11 DIAGNOSIS — E1139 Type 2 diabetes mellitus with other diabetic ophthalmic complication: Secondary | ICD-10-CM

## 2012-01-11 DIAGNOSIS — I10 Essential (primary) hypertension: Secondary | ICD-10-CM

## 2012-01-11 DIAGNOSIS — H3560 Retinal hemorrhage, unspecified eye: Secondary | ICD-10-CM

## 2012-01-11 DIAGNOSIS — H2510 Age-related nuclear cataract, unspecified eye: Secondary | ICD-10-CM

## 2012-02-03 DIAGNOSIS — E119 Type 2 diabetes mellitus without complications: Secondary | ICD-10-CM | POA: Diagnosis not present

## 2012-02-03 DIAGNOSIS — E782 Mixed hyperlipidemia: Secondary | ICD-10-CM | POA: Diagnosis not present

## 2012-02-03 DIAGNOSIS — I10 Essential (primary) hypertension: Secondary | ICD-10-CM | POA: Diagnosis not present

## 2012-03-15 DIAGNOSIS — L270 Generalized skin eruption due to drugs and medicaments taken internally: Secondary | ICD-10-CM | POA: Diagnosis not present

## 2012-03-21 DIAGNOSIS — S63639A Sprain of interphalangeal joint of unspecified finger, initial encounter: Secondary | ICD-10-CM | POA: Diagnosis not present

## 2012-03-28 DIAGNOSIS — N289 Disorder of kidney and ureter, unspecified: Secondary | ICD-10-CM | POA: Diagnosis not present

## 2012-03-28 DIAGNOSIS — S63639A Sprain of interphalangeal joint of unspecified finger, initial encounter: Secondary | ICD-10-CM | POA: Diagnosis not present

## 2012-04-04 DIAGNOSIS — S63639A Sprain of interphalangeal joint of unspecified finger, initial encounter: Secondary | ICD-10-CM | POA: Diagnosis not present

## 2012-04-04 DIAGNOSIS — M109 Gout, unspecified: Secondary | ICD-10-CM | POA: Diagnosis not present

## 2012-04-13 DIAGNOSIS — S63639A Sprain of interphalangeal joint of unspecified finger, initial encounter: Secondary | ICD-10-CM | POA: Diagnosis not present

## 2012-06-08 DIAGNOSIS — N182 Chronic kidney disease, stage 2 (mild): Secondary | ICD-10-CM | POA: Diagnosis not present

## 2012-06-08 DIAGNOSIS — E782 Mixed hyperlipidemia: Secondary | ICD-10-CM | POA: Diagnosis not present

## 2012-06-08 DIAGNOSIS — I10 Essential (primary) hypertension: Secondary | ICD-10-CM | POA: Diagnosis not present

## 2012-06-08 DIAGNOSIS — E1129 Type 2 diabetes mellitus with other diabetic kidney complication: Secondary | ICD-10-CM | POA: Diagnosis not present

## 2012-06-14 DIAGNOSIS — Z1231 Encounter for screening mammogram for malignant neoplasm of breast: Secondary | ICD-10-CM | POA: Diagnosis not present

## 2012-06-14 DIAGNOSIS — Z803 Family history of malignant neoplasm of breast: Secondary | ICD-10-CM | POA: Diagnosis not present

## 2012-06-20 DIAGNOSIS — N6489 Other specified disorders of breast: Secondary | ICD-10-CM | POA: Diagnosis not present

## 2012-07-12 ENCOUNTER — Ambulatory Visit (INDEPENDENT_AMBULATORY_CARE_PROVIDER_SITE_OTHER): Payer: Medicare Other | Admitting: Ophthalmology

## 2012-07-23 ENCOUNTER — Ambulatory Visit (INDEPENDENT_AMBULATORY_CARE_PROVIDER_SITE_OTHER): Payer: Medicare Other | Admitting: Ophthalmology

## 2012-07-23 DIAGNOSIS — H35039 Hypertensive retinopathy, unspecified eye: Secondary | ICD-10-CM | POA: Diagnosis not present

## 2012-07-23 DIAGNOSIS — J069 Acute upper respiratory infection, unspecified: Secondary | ICD-10-CM | POA: Diagnosis not present

## 2012-07-23 DIAGNOSIS — E1165 Type 2 diabetes mellitus with hyperglycemia: Secondary | ICD-10-CM

## 2012-07-23 DIAGNOSIS — E11319 Type 2 diabetes mellitus with unspecified diabetic retinopathy without macular edema: Secondary | ICD-10-CM

## 2012-07-23 DIAGNOSIS — I10 Essential (primary) hypertension: Secondary | ICD-10-CM

## 2012-07-23 DIAGNOSIS — D869 Sarcoidosis, unspecified: Secondary | ICD-10-CM | POA: Diagnosis not present

## 2012-07-23 DIAGNOSIS — E1139 Type 2 diabetes mellitus with other diabetic ophthalmic complication: Secondary | ICD-10-CM

## 2012-07-23 DIAGNOSIS — H43819 Vitreous degeneration, unspecified eye: Secondary | ICD-10-CM

## 2012-07-23 DIAGNOSIS — R238 Other skin changes: Secondary | ICD-10-CM | POA: Diagnosis not present

## 2012-07-23 DIAGNOSIS — H2510 Age-related nuclear cataract, unspecified eye: Secondary | ICD-10-CM

## 2012-11-09 DIAGNOSIS — E1129 Type 2 diabetes mellitus with other diabetic kidney complication: Secondary | ICD-10-CM | POA: Diagnosis not present

## 2012-11-09 DIAGNOSIS — E785 Hyperlipidemia, unspecified: Secondary | ICD-10-CM | POA: Diagnosis not present

## 2012-11-09 DIAGNOSIS — R809 Proteinuria, unspecified: Secondary | ICD-10-CM | POA: Diagnosis not present

## 2012-11-09 DIAGNOSIS — I10 Essential (primary) hypertension: Secondary | ICD-10-CM | POA: Diagnosis not present

## 2013-01-17 DIAGNOSIS — H2510 Age-related nuclear cataract, unspecified eye: Secondary | ICD-10-CM | POA: Diagnosis not present

## 2013-03-08 DIAGNOSIS — E782 Mixed hyperlipidemia: Secondary | ICD-10-CM | POA: Diagnosis not present

## 2013-03-08 DIAGNOSIS — I10 Essential (primary) hypertension: Secondary | ICD-10-CM | POA: Diagnosis not present

## 2013-03-08 DIAGNOSIS — E1129 Type 2 diabetes mellitus with other diabetic kidney complication: Secondary | ICD-10-CM | POA: Diagnosis not present

## 2013-03-08 DIAGNOSIS — R259 Unspecified abnormal involuntary movements: Secondary | ICD-10-CM | POA: Diagnosis not present

## 2013-04-18 DIAGNOSIS — H2510 Age-related nuclear cataract, unspecified eye: Secondary | ICD-10-CM | POA: Diagnosis not present

## 2013-04-18 DIAGNOSIS — E119 Type 2 diabetes mellitus without complications: Secondary | ICD-10-CM | POA: Diagnosis not present

## 2013-05-13 DIAGNOSIS — H2510 Age-related nuclear cataract, unspecified eye: Secondary | ICD-10-CM | POA: Diagnosis not present

## 2013-06-03 DIAGNOSIS — H269 Unspecified cataract: Secondary | ICD-10-CM | POA: Diagnosis not present

## 2013-06-03 DIAGNOSIS — H2510 Age-related nuclear cataract, unspecified eye: Secondary | ICD-10-CM | POA: Diagnosis not present

## 2013-06-03 DIAGNOSIS — H2589 Other age-related cataract: Secondary | ICD-10-CM | POA: Diagnosis not present

## 2013-06-10 DIAGNOSIS — Z780 Asymptomatic menopausal state: Secondary | ICD-10-CM | POA: Diagnosis not present

## 2013-06-10 DIAGNOSIS — Z1231 Encounter for screening mammogram for malignant neoplasm of breast: Secondary | ICD-10-CM | POA: Diagnosis not present

## 2013-07-05 DIAGNOSIS — M899 Disorder of bone, unspecified: Secondary | ICD-10-CM | POA: Diagnosis not present

## 2013-07-05 DIAGNOSIS — Z23 Encounter for immunization: Secondary | ICD-10-CM | POA: Diagnosis not present

## 2013-07-05 DIAGNOSIS — I10 Essential (primary) hypertension: Secondary | ICD-10-CM | POA: Diagnosis not present

## 2013-07-05 DIAGNOSIS — E119 Type 2 diabetes mellitus without complications: Secondary | ICD-10-CM | POA: Diagnosis not present

## 2013-07-05 DIAGNOSIS — E785 Hyperlipidemia, unspecified: Secondary | ICD-10-CM | POA: Diagnosis not present

## 2013-08-23 DIAGNOSIS — H01029 Squamous blepharitis unspecified eye, unspecified eyelid: Secondary | ICD-10-CM | POA: Diagnosis not present

## 2013-08-23 DIAGNOSIS — H5710 Ocular pain, unspecified eye: Secondary | ICD-10-CM | POA: Diagnosis not present

## 2013-08-23 DIAGNOSIS — T1500XA Foreign body in cornea, unspecified eye, initial encounter: Secondary | ICD-10-CM | POA: Diagnosis not present

## 2013-09-06 DIAGNOSIS — R42 Dizziness and giddiness: Secondary | ICD-10-CM | POA: Diagnosis not present

## 2013-09-09 DIAGNOSIS — H04129 Dry eye syndrome of unspecified lacrimal gland: Secondary | ICD-10-CM | POA: Diagnosis not present

## 2013-09-10 DIAGNOSIS — R42 Dizziness and giddiness: Secondary | ICD-10-CM | POA: Diagnosis not present

## 2013-09-10 DIAGNOSIS — H5789 Other specified disorders of eye and adnexa: Secondary | ICD-10-CM | POA: Diagnosis not present

## 2013-11-22 DIAGNOSIS — I10 Essential (primary) hypertension: Secondary | ICD-10-CM | POA: Diagnosis not present

## 2013-11-22 DIAGNOSIS — E119 Type 2 diabetes mellitus without complications: Secondary | ICD-10-CM | POA: Diagnosis not present

## 2013-11-22 DIAGNOSIS — M899 Disorder of bone, unspecified: Secondary | ICD-10-CM | POA: Diagnosis not present

## 2013-11-22 DIAGNOSIS — E782 Mixed hyperlipidemia: Secondary | ICD-10-CM | POA: Diagnosis not present

## 2014-01-23 DIAGNOSIS — H2510 Age-related nuclear cataract, unspecified eye: Secondary | ICD-10-CM | POA: Diagnosis not present

## 2014-01-23 DIAGNOSIS — E119 Type 2 diabetes mellitus without complications: Secondary | ICD-10-CM | POA: Diagnosis not present

## 2014-05-09 DIAGNOSIS — Z23 Encounter for immunization: Secondary | ICD-10-CM | POA: Diagnosis not present

## 2014-05-09 DIAGNOSIS — Z1389 Encounter for screening for other disorder: Secondary | ICD-10-CM | POA: Diagnosis not present

## 2014-05-09 DIAGNOSIS — E119 Type 2 diabetes mellitus without complications: Secondary | ICD-10-CM | POA: Diagnosis not present

## 2014-05-09 DIAGNOSIS — D860 Sarcoidosis of lung: Secondary | ICD-10-CM | POA: Diagnosis not present

## 2014-05-09 DIAGNOSIS — J309 Allergic rhinitis, unspecified: Secondary | ICD-10-CM | POA: Diagnosis not present

## 2014-05-09 DIAGNOSIS — Z0000 Encounter for general adult medical examination without abnormal findings: Secondary | ICD-10-CM | POA: Diagnosis not present

## 2014-05-09 DIAGNOSIS — I10 Essential (primary) hypertension: Secondary | ICD-10-CM | POA: Diagnosis not present

## 2014-05-16 DIAGNOSIS — Z1211 Encounter for screening for malignant neoplasm of colon: Secondary | ICD-10-CM | POA: Diagnosis not present

## 2014-08-14 DIAGNOSIS — H524 Presbyopia: Secondary | ICD-10-CM | POA: Diagnosis not present

## 2014-08-14 DIAGNOSIS — H2512 Age-related nuclear cataract, left eye: Secondary | ICD-10-CM | POA: Diagnosis not present

## 2014-08-14 DIAGNOSIS — H26491 Other secondary cataract, right eye: Secondary | ICD-10-CM | POA: Diagnosis not present

## 2014-08-14 DIAGNOSIS — E119 Type 2 diabetes mellitus without complications: Secondary | ICD-10-CM | POA: Diagnosis not present

## 2014-11-07 DIAGNOSIS — E139 Other specified diabetes mellitus without complications: Secondary | ICD-10-CM | POA: Diagnosis not present

## 2014-11-07 DIAGNOSIS — E785 Hyperlipidemia, unspecified: Secondary | ICD-10-CM | POA: Diagnosis not present

## 2014-11-07 DIAGNOSIS — I10 Essential (primary) hypertension: Secondary | ICD-10-CM | POA: Diagnosis not present

## 2014-11-07 DIAGNOSIS — M899 Disorder of bone, unspecified: Secondary | ICD-10-CM | POA: Diagnosis not present

## 2015-05-11 DIAGNOSIS — Z0000 Encounter for general adult medical examination without abnormal findings: Secondary | ICD-10-CM | POA: Diagnosis not present

## 2015-05-11 DIAGNOSIS — I10 Essential (primary) hypertension: Secondary | ICD-10-CM | POA: Diagnosis not present

## 2015-05-11 DIAGNOSIS — M899 Disorder of bone, unspecified: Secondary | ICD-10-CM | POA: Diagnosis not present

## 2015-05-11 DIAGNOSIS — Z1389 Encounter for screening for other disorder: Secondary | ICD-10-CM | POA: Diagnosis not present

## 2015-05-11 DIAGNOSIS — D860 Sarcoidosis of lung: Secondary | ICD-10-CM | POA: Diagnosis not present

## 2015-05-11 DIAGNOSIS — E139 Other specified diabetes mellitus without complications: Secondary | ICD-10-CM | POA: Diagnosis not present

## 2015-05-11 DIAGNOSIS — E785 Hyperlipidemia, unspecified: Secondary | ICD-10-CM | POA: Diagnosis not present

## 2015-05-11 DIAGNOSIS — Z23 Encounter for immunization: Secondary | ICD-10-CM | POA: Diagnosis not present

## 2015-05-22 DIAGNOSIS — Z1231 Encounter for screening mammogram for malignant neoplasm of breast: Secondary | ICD-10-CM | POA: Diagnosis not present

## 2015-05-22 DIAGNOSIS — Z803 Family history of malignant neoplasm of breast: Secondary | ICD-10-CM | POA: Diagnosis not present

## 2015-11-17 DIAGNOSIS — E139 Other specified diabetes mellitus without complications: Secondary | ICD-10-CM | POA: Diagnosis not present

## 2015-11-17 DIAGNOSIS — E7800 Pure hypercholesterolemia, unspecified: Secondary | ICD-10-CM | POA: Diagnosis not present

## 2015-11-17 DIAGNOSIS — I10 Essential (primary) hypertension: Secondary | ICD-10-CM | POA: Diagnosis not present

## 2016-03-10 DIAGNOSIS — H524 Presbyopia: Secondary | ICD-10-CM | POA: Diagnosis not present

## 2016-03-10 DIAGNOSIS — H26491 Other secondary cataract, right eye: Secondary | ICD-10-CM | POA: Diagnosis not present

## 2016-03-10 DIAGNOSIS — H25812 Combined forms of age-related cataract, left eye: Secondary | ICD-10-CM | POA: Diagnosis not present

## 2016-05-19 DIAGNOSIS — Z1389 Encounter for screening for other disorder: Secondary | ICD-10-CM | POA: Diagnosis not present

## 2016-05-19 DIAGNOSIS — D860 Sarcoidosis of lung: Secondary | ICD-10-CM | POA: Diagnosis not present

## 2016-05-19 DIAGNOSIS — Z0000 Encounter for general adult medical examination without abnormal findings: Secondary | ICD-10-CM | POA: Diagnosis not present

## 2016-05-19 DIAGNOSIS — E7800 Pure hypercholesterolemia, unspecified: Secondary | ICD-10-CM | POA: Diagnosis not present

## 2016-05-19 DIAGNOSIS — Z23 Encounter for immunization: Secondary | ICD-10-CM | POA: Diagnosis not present

## 2016-05-19 DIAGNOSIS — E139 Other specified diabetes mellitus without complications: Secondary | ICD-10-CM | POA: Diagnosis not present

## 2016-05-19 DIAGNOSIS — M899 Disorder of bone, unspecified: Secondary | ICD-10-CM | POA: Diagnosis not present

## 2016-05-19 DIAGNOSIS — I10 Essential (primary) hypertension: Secondary | ICD-10-CM | POA: Diagnosis not present

## 2016-05-20 ENCOUNTER — Other Ambulatory Visit: Payer: Self-pay | Admitting: Internal Medicine

## 2016-05-20 DIAGNOSIS — M899 Disorder of bone, unspecified: Secondary | ICD-10-CM

## 2016-05-23 DIAGNOSIS — Z1231 Encounter for screening mammogram for malignant neoplasm of breast: Secondary | ICD-10-CM | POA: Diagnosis not present

## 2016-05-23 DIAGNOSIS — Z803 Family history of malignant neoplasm of breast: Secondary | ICD-10-CM | POA: Diagnosis not present

## 2016-05-23 DIAGNOSIS — M8589 Other specified disorders of bone density and structure, multiple sites: Secondary | ICD-10-CM | POA: Diagnosis not present

## 2016-05-27 DIAGNOSIS — Z1211 Encounter for screening for malignant neoplasm of colon: Secondary | ICD-10-CM | POA: Diagnosis not present

## 2016-08-25 DIAGNOSIS — R0981 Nasal congestion: Secondary | ICD-10-CM | POA: Diagnosis not present

## 2016-08-25 DIAGNOSIS — R509 Fever, unspecified: Secondary | ICD-10-CM | POA: Diagnosis not present

## 2016-09-09 ENCOUNTER — Other Ambulatory Visit: Payer: Self-pay | Admitting: Gastroenterology

## 2016-10-11 ENCOUNTER — Ambulatory Visit (HOSPITAL_COMMUNITY): Admission: RE | Admit: 2016-10-11 | Payer: Medicare Other | Source: Ambulatory Visit | Admitting: Gastroenterology

## 2016-10-11 ENCOUNTER — Encounter (HOSPITAL_COMMUNITY): Admission: RE | Payer: Self-pay | Source: Ambulatory Visit

## 2016-10-11 SURGERY — COLONOSCOPY WITH PROPOFOL
Anesthesia: Monitor Anesthesia Care

## 2016-11-29 DIAGNOSIS — M899 Disorder of bone, unspecified: Secondary | ICD-10-CM | POA: Diagnosis not present

## 2016-11-29 DIAGNOSIS — Z1211 Encounter for screening for malignant neoplasm of colon: Secondary | ICD-10-CM | POA: Diagnosis not present

## 2016-11-29 DIAGNOSIS — E1165 Type 2 diabetes mellitus with hyperglycemia: Secondary | ICD-10-CM | POA: Diagnosis not present

## 2016-11-29 DIAGNOSIS — I10 Essential (primary) hypertension: Secondary | ICD-10-CM | POA: Diagnosis not present

## 2016-11-29 DIAGNOSIS — E119 Type 2 diabetes mellitus without complications: Secondary | ICD-10-CM | POA: Diagnosis not present

## 2016-11-29 DIAGNOSIS — E7800 Pure hypercholesterolemia, unspecified: Secondary | ICD-10-CM | POA: Diagnosis not present

## 2016-12-08 DIAGNOSIS — K219 Gastro-esophageal reflux disease without esophagitis: Secondary | ICD-10-CM | POA: Diagnosis not present

## 2016-12-08 DIAGNOSIS — Z1211 Encounter for screening for malignant neoplasm of colon: Secondary | ICD-10-CM | POA: Diagnosis not present

## 2016-12-22 DIAGNOSIS — Z1211 Encounter for screening for malignant neoplasm of colon: Secondary | ICD-10-CM | POA: Diagnosis not present

## 2016-12-22 DIAGNOSIS — D126 Benign neoplasm of colon, unspecified: Secondary | ICD-10-CM | POA: Diagnosis not present

## 2016-12-22 DIAGNOSIS — D175 Benign lipomatous neoplasm of intra-abdominal organs: Secondary | ICD-10-CM | POA: Diagnosis not present

## 2016-12-22 DIAGNOSIS — K648 Other hemorrhoids: Secondary | ICD-10-CM | POA: Diagnosis not present

## 2016-12-29 DIAGNOSIS — Z1211 Encounter for screening for malignant neoplasm of colon: Secondary | ICD-10-CM | POA: Diagnosis not present

## 2016-12-29 DIAGNOSIS — D126 Benign neoplasm of colon, unspecified: Secondary | ICD-10-CM | POA: Diagnosis not present

## 2017-04-13 DIAGNOSIS — H26491 Other secondary cataract, right eye: Secondary | ICD-10-CM | POA: Diagnosis not present

## 2017-04-13 DIAGNOSIS — H524 Presbyopia: Secondary | ICD-10-CM | POA: Diagnosis not present

## 2017-04-13 DIAGNOSIS — H25812 Combined forms of age-related cataract, left eye: Secondary | ICD-10-CM | POA: Diagnosis not present

## 2017-04-13 DIAGNOSIS — H52223 Regular astigmatism, bilateral: Secondary | ICD-10-CM | POA: Diagnosis not present

## 2017-04-13 DIAGNOSIS — R05 Cough: Secondary | ICD-10-CM | POA: Diagnosis not present

## 2017-04-13 DIAGNOSIS — H43813 Vitreous degeneration, bilateral: Secondary | ICD-10-CM | POA: Diagnosis not present

## 2017-04-13 DIAGNOSIS — H35033 Hypertensive retinopathy, bilateral: Secondary | ICD-10-CM | POA: Diagnosis not present

## 2017-05-26 DIAGNOSIS — Z1231 Encounter for screening mammogram for malignant neoplasm of breast: Secondary | ICD-10-CM | POA: Diagnosis not present

## 2017-05-26 DIAGNOSIS — K219 Gastro-esophageal reflux disease without esophagitis: Secondary | ICD-10-CM | POA: Diagnosis not present

## 2017-05-26 DIAGNOSIS — I10 Essential (primary) hypertension: Secondary | ICD-10-CM | POA: Diagnosis not present

## 2017-05-26 DIAGNOSIS — Z23 Encounter for immunization: Secondary | ICD-10-CM | POA: Diagnosis not present

## 2017-05-26 DIAGNOSIS — Z803 Family history of malignant neoplasm of breast: Secondary | ICD-10-CM | POA: Diagnosis not present

## 2017-05-26 DIAGNOSIS — Z0000 Encounter for general adult medical examination without abnormal findings: Secondary | ICD-10-CM | POA: Diagnosis not present

## 2017-05-26 DIAGNOSIS — D860 Sarcoidosis of lung: Secondary | ICD-10-CM | POA: Diagnosis not present

## 2017-05-26 DIAGNOSIS — Z1389 Encounter for screening for other disorder: Secondary | ICD-10-CM | POA: Diagnosis not present

## 2017-05-26 DIAGNOSIS — E119 Type 2 diabetes mellitus without complications: Secondary | ICD-10-CM | POA: Diagnosis not present

## 2017-05-26 DIAGNOSIS — M899 Disorder of bone, unspecified: Secondary | ICD-10-CM | POA: Diagnosis not present

## 2017-05-26 DIAGNOSIS — E7800 Pure hypercholesterolemia, unspecified: Secondary | ICD-10-CM | POA: Diagnosis not present

## 2017-06-08 DIAGNOSIS — E7800 Pure hypercholesterolemia, unspecified: Secondary | ICD-10-CM | POA: Diagnosis not present

## 2017-06-08 DIAGNOSIS — E119 Type 2 diabetes mellitus without complications: Secondary | ICD-10-CM | POA: Diagnosis not present

## 2017-06-08 DIAGNOSIS — I10 Essential (primary) hypertension: Secondary | ICD-10-CM | POA: Diagnosis not present

## 2017-07-13 DIAGNOSIS — H9311 Tinnitus, right ear: Secondary | ICD-10-CM | POA: Diagnosis not present

## 2017-07-18 DIAGNOSIS — H905 Unspecified sensorineural hearing loss: Secondary | ICD-10-CM | POA: Diagnosis not present

## 2017-07-18 DIAGNOSIS — H9041 Sensorineural hearing loss, unilateral, right ear, with unrestricted hearing on the contralateral side: Secondary | ICD-10-CM | POA: Diagnosis not present

## 2017-07-18 DIAGNOSIS — H9311 Tinnitus, right ear: Secondary | ICD-10-CM | POA: Diagnosis not present

## 2017-08-04 ENCOUNTER — Other Ambulatory Visit: Payer: Self-pay | Admitting: Otolaryngology

## 2017-08-04 DIAGNOSIS — H903 Sensorineural hearing loss, bilateral: Secondary | ICD-10-CM

## 2017-08-04 DIAGNOSIS — H905 Unspecified sensorineural hearing loss: Secondary | ICD-10-CM

## 2017-08-08 DIAGNOSIS — J040 Acute laryngitis: Secondary | ICD-10-CM | POA: Diagnosis not present

## 2017-08-08 DIAGNOSIS — H9041 Sensorineural hearing loss, unilateral, right ear, with unrestricted hearing on the contralateral side: Secondary | ICD-10-CM | POA: Diagnosis not present

## 2017-08-08 DIAGNOSIS — J320 Chronic maxillary sinusitis: Secondary | ICD-10-CM | POA: Diagnosis not present

## 2017-08-08 DIAGNOSIS — J039 Acute tonsillitis, unspecified: Secondary | ICD-10-CM | POA: Diagnosis not present

## 2017-08-08 DIAGNOSIS — J322 Chronic ethmoidal sinusitis: Secondary | ICD-10-CM | POA: Diagnosis not present

## 2017-08-08 DIAGNOSIS — H9311 Tinnitus, right ear: Secondary | ICD-10-CM | POA: Diagnosis not present

## 2017-08-16 ENCOUNTER — Other Ambulatory Visit (HOSPITAL_COMMUNITY): Payer: Self-pay | Admitting: Otolaryngology

## 2017-08-16 DIAGNOSIS — J322 Chronic ethmoidal sinusitis: Secondary | ICD-10-CM | POA: Diagnosis not present

## 2017-08-16 DIAGNOSIS — H903 Sensorineural hearing loss, bilateral: Secondary | ICD-10-CM | POA: Diagnosis not present

## 2017-08-16 DIAGNOSIS — H9311 Tinnitus, right ear: Secondary | ICD-10-CM | POA: Diagnosis not present

## 2017-08-16 DIAGNOSIS — J320 Chronic maxillary sinusitis: Secondary | ICD-10-CM | POA: Diagnosis not present

## 2017-08-17 ENCOUNTER — Other Ambulatory Visit (HOSPITAL_COMMUNITY): Payer: Self-pay | Admitting: Otolaryngology

## 2017-08-17 DIAGNOSIS — H9311 Tinnitus, right ear: Secondary | ICD-10-CM

## 2017-08-22 ENCOUNTER — Encounter (HOSPITAL_COMMUNITY): Payer: Self-pay | Admitting: Radiology

## 2017-08-22 ENCOUNTER — Ambulatory Visit (HOSPITAL_COMMUNITY)
Admission: RE | Admit: 2017-08-22 | Discharge: 2017-08-22 | Disposition: A | Discharge status: Home / Self Care | Payer: Medicare Other | Source: Ambulatory Visit | Attending: Otolaryngology | Admitting: Otolaryngology

## 2017-08-22 DIAGNOSIS — I6782 Cerebral ischemia: Secondary | ICD-10-CM | POA: Diagnosis not present

## 2017-08-22 DIAGNOSIS — H9191 Unspecified hearing loss, right ear: Secondary | ICD-10-CM | POA: Diagnosis not present

## 2017-08-22 DIAGNOSIS — H9311 Tinnitus, right ear: Secondary | ICD-10-CM | POA: Diagnosis present

## 2017-08-22 LAB — POCT I-STAT CREATININE: Creatinine, Ser: 1 mg/dL (ref 0.44–1.00)

## 2017-08-22 MED ORDER — GADOBENATE DIMEGLUMINE 529 MG/ML IV SOLN
15.0000 mL | Freq: Once | INTRAVENOUS | Status: AC | PRN
  Administered 2017-08-22: 14 mL via INTRAVENOUS

## 2017-08-30 DIAGNOSIS — J322 Chronic ethmoidal sinusitis: Secondary | ICD-10-CM | POA: Diagnosis not present

## 2017-08-30 DIAGNOSIS — J040 Acute laryngitis: Secondary | ICD-10-CM | POA: Diagnosis not present

## 2017-08-30 DIAGNOSIS — J320 Chronic maxillary sinusitis: Secondary | ICD-10-CM | POA: Diagnosis not present

## 2017-11-22 DIAGNOSIS — E119 Type 2 diabetes mellitus without complications: Secondary | ICD-10-CM | POA: Diagnosis not present

## 2017-11-22 DIAGNOSIS — E7800 Pure hypercholesterolemia, unspecified: Secondary | ICD-10-CM | POA: Diagnosis not present

## 2017-11-22 DIAGNOSIS — I10 Essential (primary) hypertension: Secondary | ICD-10-CM | POA: Diagnosis not present

## 2017-11-22 DIAGNOSIS — F4320 Adjustment disorder, unspecified: Secondary | ICD-10-CM | POA: Diagnosis not present

## 2017-12-11 DIAGNOSIS — R05 Cough: Secondary | ICD-10-CM | POA: Diagnosis not present

## 2018-03-05 ENCOUNTER — Other Ambulatory Visit: Payer: Self-pay | Admitting: Internal Medicine

## 2018-03-05 ENCOUNTER — Ambulatory Visit
Admission: RE | Admit: 2018-03-05 | Discharge: 2018-03-05 | Disposition: A | Discharge status: Home / Self Care | Payer: Medicare Other | Source: Ambulatory Visit | Attending: Internal Medicine | Admitting: Internal Medicine

## 2018-03-05 DIAGNOSIS — R197 Diarrhea, unspecified: Secondary | ICD-10-CM | POA: Diagnosis not present

## 2018-03-05 DIAGNOSIS — M79604 Pain in right leg: Secondary | ICD-10-CM

## 2018-03-05 DIAGNOSIS — Z7984 Long term (current) use of oral hypoglycemic drugs: Secondary | ICD-10-CM | POA: Diagnosis not present

## 2018-03-05 DIAGNOSIS — M79661 Pain in right lower leg: Secondary | ICD-10-CM | POA: Diagnosis not present

## 2018-03-05 DIAGNOSIS — R110 Nausea: Secondary | ICD-10-CM | POA: Diagnosis not present

## 2018-03-05 DIAGNOSIS — K529 Noninfective gastroenteritis and colitis, unspecified: Secondary | ICD-10-CM | POA: Diagnosis not present

## 2018-03-05 DIAGNOSIS — E1169 Type 2 diabetes mellitus with other specified complication: Secondary | ICD-10-CM | POA: Diagnosis not present

## 2018-03-16 DIAGNOSIS — M79606 Pain in leg, unspecified: Secondary | ICD-10-CM | POA: Diagnosis not present

## 2018-03-30 DIAGNOSIS — M25561 Pain in right knee: Secondary | ICD-10-CM | POA: Diagnosis not present

## 2018-04-05 DIAGNOSIS — R0981 Nasal congestion: Secondary | ICD-10-CM | POA: Diagnosis not present

## 2018-04-05 DIAGNOSIS — H9311 Tinnitus, right ear: Secondary | ICD-10-CM | POA: Diagnosis not present

## 2018-04-10 DIAGNOSIS — M6281 Muscle weakness (generalized): Secondary | ICD-10-CM | POA: Diagnosis not present

## 2018-04-10 DIAGNOSIS — M25561 Pain in right knee: Secondary | ICD-10-CM | POA: Diagnosis not present

## 2018-04-10 DIAGNOSIS — M25661 Stiffness of right knee, not elsewhere classified: Secondary | ICD-10-CM | POA: Diagnosis not present

## 2018-04-10 DIAGNOSIS — R262 Difficulty in walking, not elsewhere classified: Secondary | ICD-10-CM | POA: Diagnosis not present

## 2018-04-16 DIAGNOSIS — M6281 Muscle weakness (generalized): Secondary | ICD-10-CM | POA: Diagnosis not present

## 2018-04-16 DIAGNOSIS — M25561 Pain in right knee: Secondary | ICD-10-CM | POA: Diagnosis not present

## 2018-04-16 DIAGNOSIS — R262 Difficulty in walking, not elsewhere classified: Secondary | ICD-10-CM | POA: Diagnosis not present

## 2018-04-16 DIAGNOSIS — M25661 Stiffness of right knee, not elsewhere classified: Secondary | ICD-10-CM | POA: Diagnosis not present

## 2018-04-19 DIAGNOSIS — M25561 Pain in right knee: Secondary | ICD-10-CM | POA: Diagnosis not present

## 2018-04-19 DIAGNOSIS — M25661 Stiffness of right knee, not elsewhere classified: Secondary | ICD-10-CM | POA: Diagnosis not present

## 2018-04-19 DIAGNOSIS — R262 Difficulty in walking, not elsewhere classified: Secondary | ICD-10-CM | POA: Diagnosis not present

## 2018-04-19 DIAGNOSIS — M6281 Muscle weakness (generalized): Secondary | ICD-10-CM | POA: Diagnosis not present

## 2018-04-24 DIAGNOSIS — M6281 Muscle weakness (generalized): Secondary | ICD-10-CM | POA: Diagnosis not present

## 2018-04-24 DIAGNOSIS — R262 Difficulty in walking, not elsewhere classified: Secondary | ICD-10-CM | POA: Diagnosis not present

## 2018-04-24 DIAGNOSIS — M25561 Pain in right knee: Secondary | ICD-10-CM | POA: Diagnosis not present

## 2018-04-24 DIAGNOSIS — M25661 Stiffness of right knee, not elsewhere classified: Secondary | ICD-10-CM | POA: Diagnosis not present

## 2018-04-26 DIAGNOSIS — R262 Difficulty in walking, not elsewhere classified: Secondary | ICD-10-CM | POA: Diagnosis not present

## 2018-04-26 DIAGNOSIS — M25561 Pain in right knee: Secondary | ICD-10-CM | POA: Diagnosis not present

## 2018-04-26 DIAGNOSIS — M25661 Stiffness of right knee, not elsewhere classified: Secondary | ICD-10-CM | POA: Diagnosis not present

## 2018-04-26 DIAGNOSIS — M6281 Muscle weakness (generalized): Secondary | ICD-10-CM | POA: Diagnosis not present

## 2018-05-10 DIAGNOSIS — H524 Presbyopia: Secondary | ICD-10-CM | POA: Diagnosis not present

## 2018-05-10 DIAGNOSIS — E119 Type 2 diabetes mellitus without complications: Secondary | ICD-10-CM | POA: Diagnosis not present

## 2018-05-10 DIAGNOSIS — H52223 Regular astigmatism, bilateral: Secondary | ICD-10-CM | POA: Diagnosis not present

## 2018-05-10 DIAGNOSIS — H43813 Vitreous degeneration, bilateral: Secondary | ICD-10-CM | POA: Diagnosis not present

## 2018-05-10 DIAGNOSIS — H25812 Combined forms of age-related cataract, left eye: Secondary | ICD-10-CM | POA: Diagnosis not present

## 2018-05-10 DIAGNOSIS — H35033 Hypertensive retinopathy, bilateral: Secondary | ICD-10-CM | POA: Diagnosis not present

## 2018-05-28 DIAGNOSIS — Z803 Family history of malignant neoplasm of breast: Secondary | ICD-10-CM | POA: Diagnosis not present

## 2018-05-28 DIAGNOSIS — Z1231 Encounter for screening mammogram for malignant neoplasm of breast: Secondary | ICD-10-CM | POA: Diagnosis not present

## 2018-06-20 DIAGNOSIS — D860 Sarcoidosis of lung: Secondary | ICD-10-CM | POA: Diagnosis not present

## 2018-06-20 DIAGNOSIS — I10 Essential (primary) hypertension: Secondary | ICD-10-CM | POA: Diagnosis not present

## 2018-06-20 DIAGNOSIS — E7800 Pure hypercholesterolemia, unspecified: Secondary | ICD-10-CM | POA: Diagnosis not present

## 2018-06-20 DIAGNOSIS — M8588 Other specified disorders of bone density and structure, other site: Secondary | ICD-10-CM | POA: Diagnosis not present

## 2018-06-20 DIAGNOSIS — Z1389 Encounter for screening for other disorder: Secondary | ICD-10-CM | POA: Diagnosis not present

## 2018-06-20 DIAGNOSIS — Z0000 Encounter for general adult medical examination without abnormal findings: Secondary | ICD-10-CM | POA: Diagnosis not present

## 2018-06-20 DIAGNOSIS — E1169 Type 2 diabetes mellitus with other specified complication: Secondary | ICD-10-CM | POA: Diagnosis not present

## 2018-08-16 DIAGNOSIS — I10 Essential (primary) hypertension: Secondary | ICD-10-CM | POA: Diagnosis not present

## 2018-09-24 DIAGNOSIS — E1169 Type 2 diabetes mellitus with other specified complication: Secondary | ICD-10-CM | POA: Diagnosis not present

## 2018-11-08 IMAGING — MR MR HEAD WO/W CM
10 of 12 series · 28 of 48 positions shown · IV contrast (multihance)
Comparison: None.

CLINICAL DATA: Right-sided hearing loss and tinnitus for 3-4
months.

EXAM:
MRI HEAD WITHOUT AND WITH CONTRAST
TECHNIQUE: Multiplanar, multiecho pulse sequences of the brain and surrounding
structures were obtained without and with intravenous contrast.
CONTRAST:  14mL MULTIHANCE GADOBENATE DIMEGLUMINE 529 MG/ML IV SOLN

[Series 3: T1 · sagittal · 5.0mm · 0.47mm/px · 3 of 24 slices shown (1 of 3)]
[im 1/24]
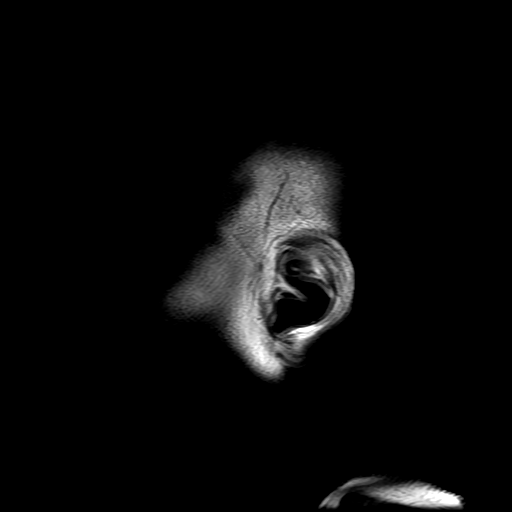
[im 12/24]
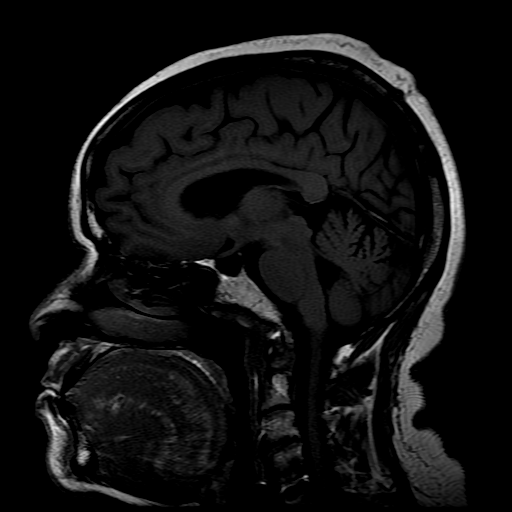
[im 24/24]
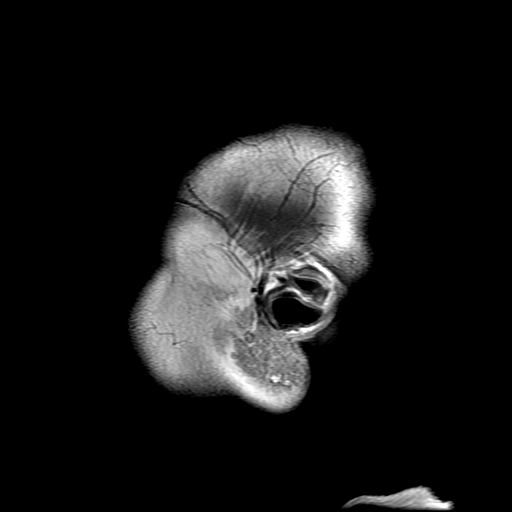

[Series 4: DWI · axial · 3.0mm · 1.09mm/px · z∈[-31,+115]mm · 8 of 100 slices shown (1 of 2)]
[im 1/100]
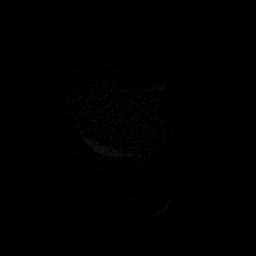
[im 12/100]
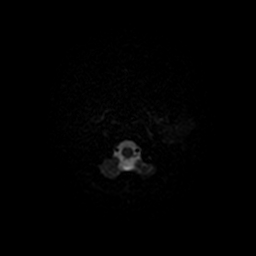
[im 34/100]
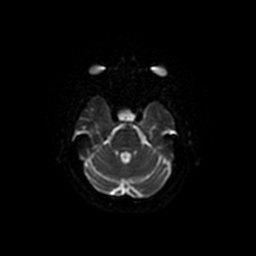
[im 45/100]
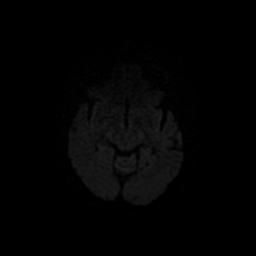
[im 56/100]
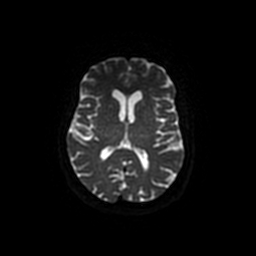
[im 67/100]
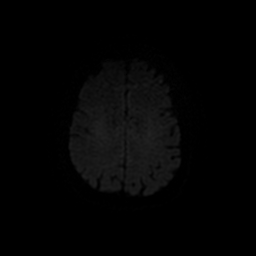
[im 89/100]
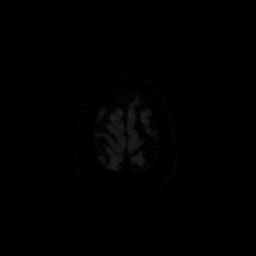
[im 100/100]
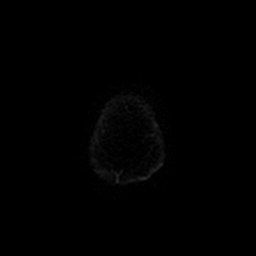

[Series 5: T2 · axial · 5.0mm · 0.43mm/px · z∈[-28,+125]mm · 2 of 23 slices shown]
[im 1/23]
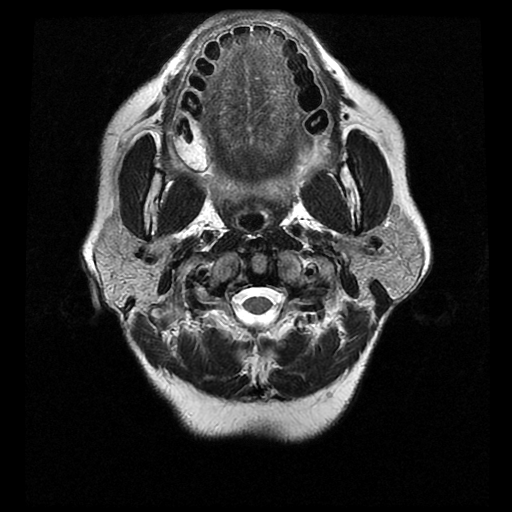
[im 23/23]
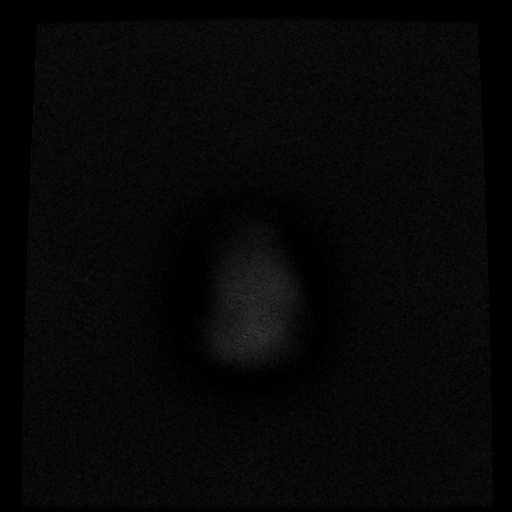

[Series 6: FLAIR · axial · 3.0mm · 0.43mm/px · z∈[-29,+126]mm · 2 of 27 slices shown]
[im 1/27]
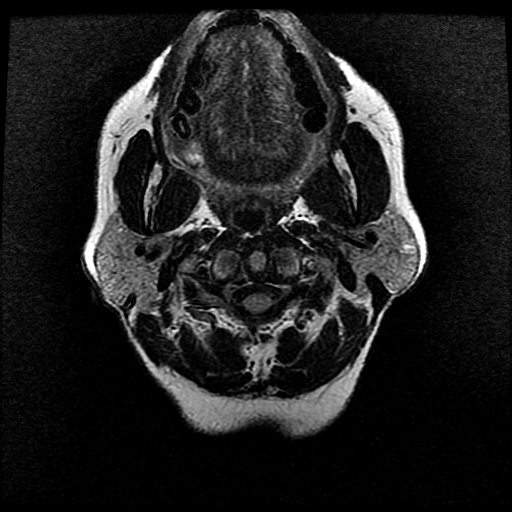
[im 27/27]
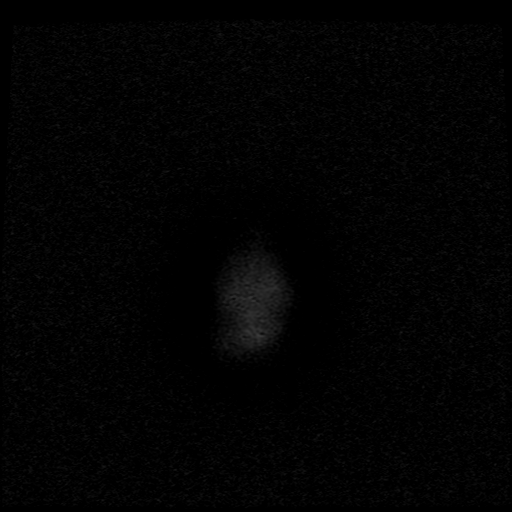

[Series 7: bSSFP · axial · 1.2mm · 0.35mm/px · z∈[-9,+12]mm · 4 of 72 slices shown]
[im 1/72]
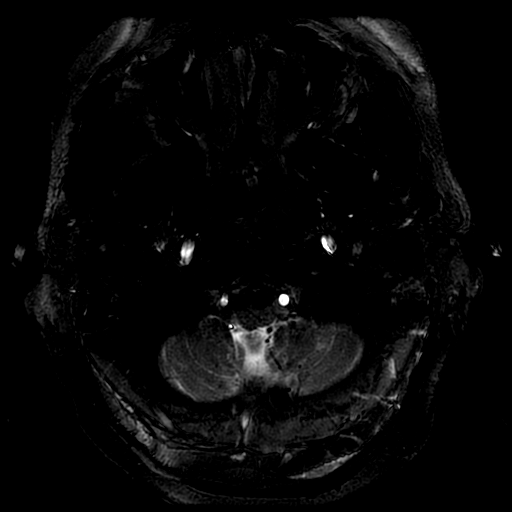
[im 12/72]
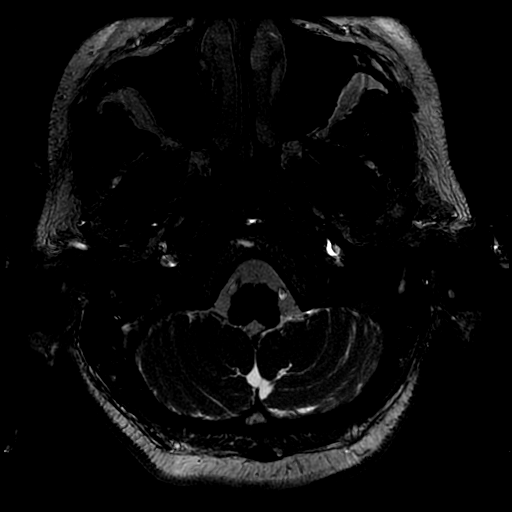
[im 24/72]
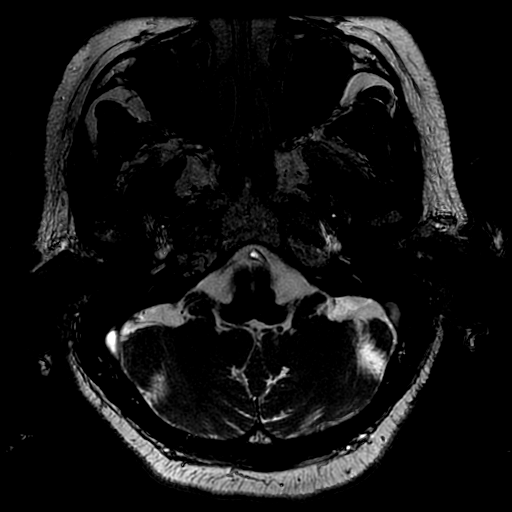
[im 36/72]
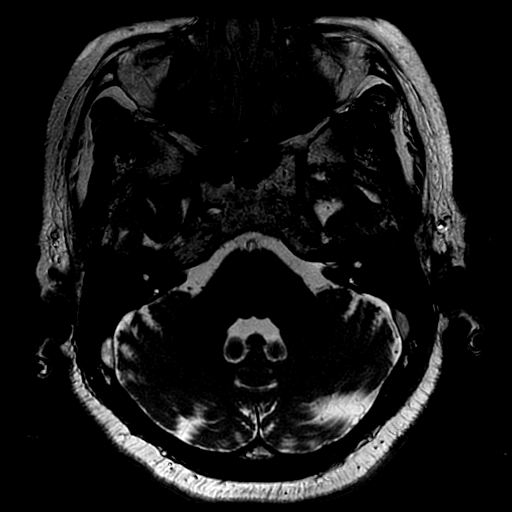

[Series 8: T1 · axial · 3.0mm · 0.35mm/px · 1 of 15 slices shown (2 of 3)]
[im 1/15]
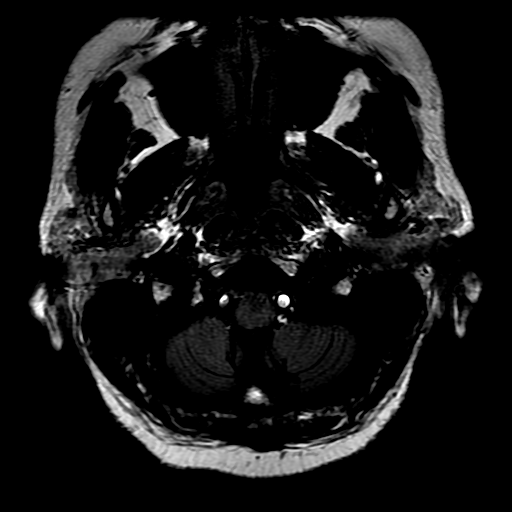

[Series 9: T1 · coronal · 3.0mm · 0.35mm/px · 1 of 15 slices shown (3 of 3)]
[im 1/15]
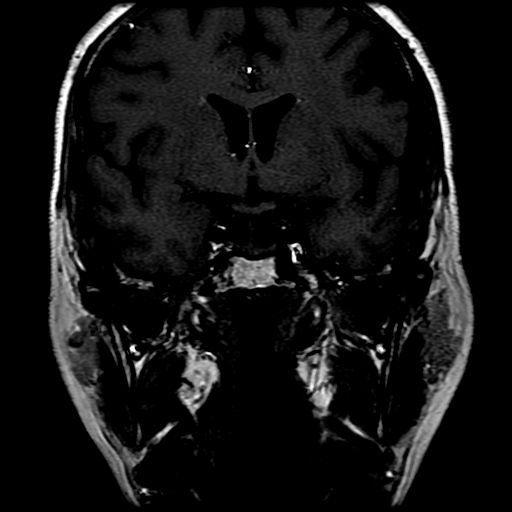

[Series 11: T1 post-contrast · axial · 3.0mm · 0.35mm/px · 1 of 15 slices shown (1 of 2)]
[im 1/15]
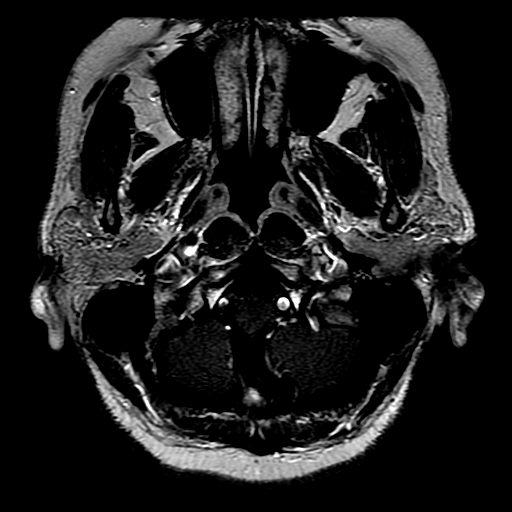

[Series 12: T1 post-contrast · coronal · 3.0mm · 0.35mm/px · 1 of 15 slices shown (2 of 2)]
[im 1/15]
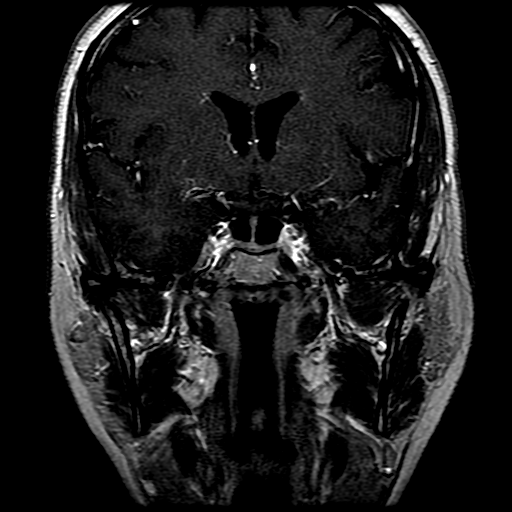

[Series 400: DWI · axial · 3.0mm · 1.09mm/px · z∈[-31,+115]mm · 5 of 50 slices shown (2 of 2)]
[im 1/50]
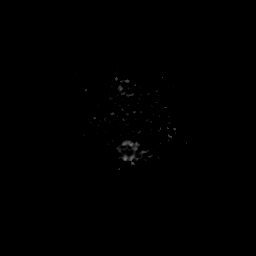
[im 13/50]
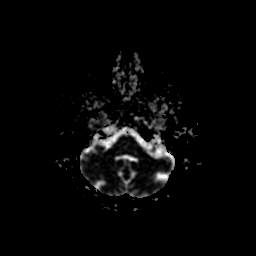
[im 25/50]
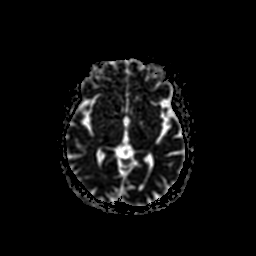
[im 37/50]
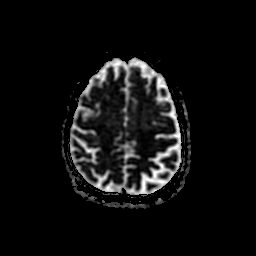
[im 50/50]
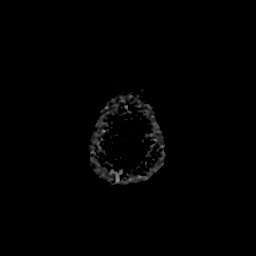

[28 of 48 positions shown; findings below may reference images not displayed]

FINDINGS: Brain: There is no evidence of acute infarct, intracranial
hemorrhage, mass, midline shift, or extra-axial fluid collection.
The ventricles and sulci are normal for age. Scattered small foci of
T2 hyperintensity in the cerebral white matter are nonspecific but
compatible with minimal chronic small vessel ischemic disease. No
abnormal enhancement is identified.

Dedicated imaging through the internal auditory canals demonstrates
a normal course of cranial nerves VII and VIII without evidence of
mass or abnormal enhancement. The right anterior inferior cerebellar
artery loops into the medial aspect of the right internal auditory
canal, a common finding which is typically considered incidental.
Inner ear structures demonstrate normal signal bilaterally. No mass
is seen within the cerebellopontine angles.

Vascular: Major intracranial vascular flow voids are preserved.

Skull and upper cervical spine: Unremarkable bone marrow signal.
Susceptibility artifact from anterior fusion at C4-5.

Sinuses/Orbits: Right cataract extraction. Minimal mucosal edema in
the mastoid air cells bilaterally. Tiny right maxillary sinus mucous
retention cyst scratch sec clear paranasal sinuses.

Other: None.
IMPRESSION: 1. Negative IAC imaging.
2. No acute intracranial abnormality.
3. Minimal chronic small vessel ischemic disease.

## 2018-12-20 DIAGNOSIS — H9319 Tinnitus, unspecified ear: Secondary | ICD-10-CM | POA: Diagnosis not present

## 2018-12-20 DIAGNOSIS — E7800 Pure hypercholesterolemia, unspecified: Secondary | ICD-10-CM | POA: Diagnosis not present

## 2018-12-20 DIAGNOSIS — H9209 Otalgia, unspecified ear: Secondary | ICD-10-CM | POA: Diagnosis not present

## 2018-12-20 DIAGNOSIS — D860 Sarcoidosis of lung: Secondary | ICD-10-CM | POA: Diagnosis not present

## 2018-12-20 DIAGNOSIS — M8588 Other specified disorders of bone density and structure, other site: Secondary | ICD-10-CM | POA: Diagnosis not present

## 2018-12-20 DIAGNOSIS — I10 Essential (primary) hypertension: Secondary | ICD-10-CM | POA: Diagnosis not present

## 2018-12-20 DIAGNOSIS — R42 Dizziness and giddiness: Secondary | ICD-10-CM | POA: Diagnosis not present

## 2018-12-20 DIAGNOSIS — E1169 Type 2 diabetes mellitus with other specified complication: Secondary | ICD-10-CM | POA: Diagnosis not present

## 2018-12-21 DIAGNOSIS — E1169 Type 2 diabetes mellitus with other specified complication: Secondary | ICD-10-CM | POA: Diagnosis not present

## 2018-12-21 DIAGNOSIS — E7800 Pure hypercholesterolemia, unspecified: Secondary | ICD-10-CM | POA: Diagnosis not present

## 2018-12-21 DIAGNOSIS — I10 Essential (primary) hypertension: Secondary | ICD-10-CM | POA: Diagnosis not present

## 2019-01-02 ENCOUNTER — Other Ambulatory Visit: Payer: Self-pay

## 2019-01-02 ENCOUNTER — Encounter (HOSPITAL_BASED_OUTPATIENT_CLINIC_OR_DEPARTMENT_OTHER): Payer: Self-pay

## 2019-01-02 ENCOUNTER — Emergency Department (HOSPITAL_BASED_OUTPATIENT_CLINIC_OR_DEPARTMENT_OTHER)
Admission: EM | Admit: 2019-01-02 | Discharge: 2019-01-02 | Disposition: A | Discharge status: Home / Self Care | Payer: Medicare Other | Attending: Emergency Medicine | Admitting: Emergency Medicine

## 2019-01-02 DIAGNOSIS — E119 Type 2 diabetes mellitus without complications: Secondary | ICD-10-CM | POA: Diagnosis not present

## 2019-01-02 DIAGNOSIS — I10 Essential (primary) hypertension: Secondary | ICD-10-CM | POA: Diagnosis not present

## 2019-01-02 HISTORY — DX: Type 2 diabetes mellitus without complications: E11.9

## 2019-01-02 HISTORY — DX: Essential (primary) hypertension: I10

## 2019-01-02 HISTORY — DX: Sarcoidosis, unspecified: D86.9

## 2019-01-02 NOTE — Discharge Instructions (Addendum)
As we discussed start taking your blood pressure medicine first thing in the morning.  Start a blood pressure log book with checking your blood pressure about 2 or 3 in the afternoon each day.  Contact your primary care doctor for follow-up.  Share your logbook blood pressure information with them.  If you are trending high they will need to make adjustments.  Return here for any severe headache, persistent chest pain, shortness of breath, or any strokelike symptoms.  Otherwise do not be overly concerned about your blood pressure readings.  As long as you are feeling fine.  But close follow-up with your primary care doctor is important.

## 2019-01-02 NOTE — ED Triage Notes (Signed)
Pt c/o BP being low in the am and high throughout the day x 3 days-spoke with PCP-states she was advised to record BP daily and f/u with no med adjustments-states she wants to be seen due to elevated-NAD-steady gait

## 2019-01-02 NOTE — ED Notes (Signed)
Pt reports that she is not having any symptoms associated with her B/P. Pt denies CP, blurred vision, or ShOB.

## 2019-01-02 NOTE — ED Provider Notes (Signed)
[facility] Provider Note   CSN: [id number] Arrival date & time: 01/02/19  2008    History   Chief Complaint Chief Complaint  Patient presents with  . Hypertension    HPI [name] is a 78 y.o. female.     Patient has a history of hypertension.  Patient has had no adjustment in her medication.  Recently was started on metformin.  Longstanding hypertension longstanding sarcoidosis.  Patient's been taking her.  Calcium channel blocker actually at night and not first thing in the morning.  She is noted that she is having normal blood pressures in the morning when she first gets up and then by afternoon and evening is getting high.  Her primary care doctor is Dr. Delfina Redwood.  Patient without any complicating factors no severe headache no stroke symptoms no chest pain no shortness of breath.  Patient was concerned because she was getting blood pressure readings like 190 at home.  Here without any additional treatment blood pressure readings came down to 150 but she had just taken her blood pressure medicine prior to coming in.     Past Medical History:  Diagnosis Date  . Diabetes mellitus without complication ([place])   . Hypertension   . Sarcoidosis     There are no active problems to display for this patient.   Past Surgical History:  Procedure Laterality Date  . ABDOMINAL HYSTERECTOMY    . ROTATOR CUFF REPAIR    . TRIGGER FINGER RELEASE       OB History   No obstetric history on file.      Home Medications    Prior to Admission medications   Not on File    Family History No family history on file.  Social History Social History   Tobacco Use  . Smoking status: Never Smoker  . Smokeless tobacco: Never Used  Substance Use Topics  . Alcohol use: Never    Frequency: Never  . Drug use: Never     Allergies   Patient has no allergy information on record.   Review of Systems Review of Systems  Constitutional: Negative for  chills and fever.  HENT: Negative for congestion, rhinorrhea and sore throat.   Eyes: Negative for visual disturbance.  Respiratory: Negative for cough and shortness of breath.   Cardiovascular: Negative for chest pain and leg swelling.  Gastrointestinal: Negative for abdominal pain, diarrhea, nausea and vomiting.  Genitourinary: Negative for dysuria.  Musculoskeletal: Negative for back pain and neck pain.  Skin: Negative for rash.  Neurological: Negative for dizziness, facial asymmetry, speech difficulty, weakness, light-headedness, numbness and headaches.  Hematological: Does not bruise/bleed easily.  Psychiatric/Behavioral: Negative for confusion.     Physical Exam Updated Vital Signs BP (!) 156/82 (BP Location: Right Arm)   Pulse 97   Temp 98.4 F (36.9 C) (Oral)   Resp 20   Ht 1.651 m (5\' 5" )   Wt 71.2 kg   SpO2 95%   BMI 26.13 kg/m   Physical Exam Vitals signs and nursing note reviewed.  Constitutional:      General: She is not in acute distress.    Appearance: Normal appearance. She is well-developed.  HENT:     Head: Normocephalic and atraumatic.  Eyes:     Extraocular Movements: Extraocular movements intact.     Conjunctiva/sclera: Conjunctivae normal.     Pupils: Pupils are equal, round, and reactive to light.  Neck:     Musculoskeletal: Normal range of motion and  neck supple.  Cardiovascular:     Rate and Rhythm: Normal rate and regular rhythm.     Heart sounds: Normal heart sounds. No murmur.  Pulmonary:     Effort: Pulmonary effort is normal. No respiratory distress.     Breath sounds: Normal breath sounds.  Abdominal:     General: Bowel sounds are normal.     Palpations: Abdomen is soft.     Tenderness: There is no abdominal tenderness.  Musculoskeletal:     Right lower leg: No edema.     Left lower leg: No edema.  Skin:    General: Skin is warm and dry.     Capillary Refill: Capillary refill takes less than 2 seconds.  Neurological:      General: No focal deficit present.     Mental Status: She is alert and oriented to person, place, and time.     Cranial Nerves: No cranial nerve deficit.     Sensory: No sensory deficit.     Motor: No weakness.     Coordination: Coordination normal.      ED Treatments / Results  Labs (all labs ordered are listed, but only abnormal results are displayed) Labs Reviewed - No data to display  EKG None  Radiology No results found.  Procedures Procedures (including critical care time)  Medications Ordered in ED Medications - No data to display   Initial Impression / Assessment and Plan / ED Course  I have reviewed the triage vital signs and the nursing notes.  Pertinent labs & imaging results that were available during my care of the patient were reviewed by me and considered in my medical decision making (see chart for details).       Patient probably would be better served taking her blood pressure medicine first thing in the morning.  And then keeping a blood pressure log sometime in the afternoon when she is feeling relaxed and calm.  And then following back up with her primary care doctor.  No hypertensive urgency or emergency here.  So patients can start taking her blood pressure first thing in the morning and then will check a log in the afternoon keep recording of that and then follow back up with her regular doctor.  She will call and make an appointment.  She will return for any new or worse symptoms to include any headache strokelike symptoms chest pain or shortness of breath.  Patient nontoxic no acute distress.   Final Clinical Impressions(s) / ED Diagnoses   Final diagnoses:  Essential hypertension    ED Discharge Orders    None       Fredia Sorrow, MD 01/02/19 2312

## 2019-01-04 DIAGNOSIS — I10 Essential (primary) hypertension: Secondary | ICD-10-CM | POA: Diagnosis not present

## 2019-01-04 DIAGNOSIS — E1169 Type 2 diabetes mellitus with other specified complication: Secondary | ICD-10-CM | POA: Diagnosis not present

## 2019-01-10 DIAGNOSIS — I10 Essential (primary) hypertension: Secondary | ICD-10-CM | POA: Diagnosis not present

## 2019-01-18 DIAGNOSIS — H905 Unspecified sensorineural hearing loss: Secondary | ICD-10-CM | POA: Diagnosis not present

## 2019-01-18 DIAGNOSIS — H9311 Tinnitus, right ear: Secondary | ICD-10-CM | POA: Diagnosis not present

## 2019-01-18 DIAGNOSIS — H903 Sensorineural hearing loss, bilateral: Secondary | ICD-10-CM | POA: Diagnosis not present

## 2019-01-18 DIAGNOSIS — H9313 Tinnitus, bilateral: Secondary | ICD-10-CM | POA: Diagnosis not present

## 2019-01-21 ENCOUNTER — Other Ambulatory Visit: Payer: Self-pay | Admitting: *Deleted

## 2019-01-21 DIAGNOSIS — Z20822 Contact with and (suspected) exposure to covid-19: Secondary | ICD-10-CM

## 2019-01-27 LAB — NOVEL CORONAVIRUS, NAA: SARS-CoV-2, NAA: NOT DETECTED

## 2019-06-03 DIAGNOSIS — Z1231 Encounter for screening mammogram for malignant neoplasm of breast: Secondary | ICD-10-CM | POA: Diagnosis not present

## 2019-06-03 DIAGNOSIS — Z803 Family history of malignant neoplasm of breast: Secondary | ICD-10-CM | POA: Diagnosis not present

## 2019-06-14 DIAGNOSIS — E1169 Type 2 diabetes mellitus with other specified complication: Secondary | ICD-10-CM | POA: Diagnosis not present

## 2019-06-14 DIAGNOSIS — I10 Essential (primary) hypertension: Secondary | ICD-10-CM | POA: Diagnosis not present

## 2019-06-14 DIAGNOSIS — R079 Chest pain, unspecified: Secondary | ICD-10-CM | POA: Diagnosis not present

## 2019-06-17 DIAGNOSIS — H43813 Vitreous degeneration, bilateral: Secondary | ICD-10-CM | POA: Diagnosis not present

## 2019-06-17 DIAGNOSIS — H35033 Hypertensive retinopathy, bilateral: Secondary | ICD-10-CM | POA: Diagnosis not present

## 2019-06-17 DIAGNOSIS — E119 Type 2 diabetes mellitus without complications: Secondary | ICD-10-CM | POA: Diagnosis not present

## 2019-06-17 DIAGNOSIS — H524 Presbyopia: Secondary | ICD-10-CM | POA: Diagnosis not present

## 2019-06-17 DIAGNOSIS — H25812 Combined forms of age-related cataract, left eye: Secondary | ICD-10-CM | POA: Diagnosis not present

## 2019-07-15 DIAGNOSIS — H2513 Age-related nuclear cataract, bilateral: Secondary | ICD-10-CM | POA: Diagnosis not present

## 2019-07-15 DIAGNOSIS — H2512 Age-related nuclear cataract, left eye: Secondary | ICD-10-CM | POA: Diagnosis not present

## 2019-07-16 DIAGNOSIS — E119 Type 2 diabetes mellitus without complications: Secondary | ICD-10-CM | POA: Diagnosis not present

## 2019-07-16 DIAGNOSIS — H25042 Posterior subcapsular polar age-related cataract, left eye: Secondary | ICD-10-CM | POA: Diagnosis not present

## 2019-08-20 DIAGNOSIS — M8589 Other specified disorders of bone density and structure, multiple sites: Secondary | ICD-10-CM | POA: Diagnosis not present

## 2019-08-20 DIAGNOSIS — Z780 Asymptomatic menopausal state: Secondary | ICD-10-CM | POA: Diagnosis not present

## 2020-01-01 DIAGNOSIS — H59032 Cystoid macular edema following cataract surgery, left eye: Secondary | ICD-10-CM | POA: Diagnosis not present

## 2020-01-01 DIAGNOSIS — E119 Type 2 diabetes mellitus without complications: Secondary | ICD-10-CM | POA: Diagnosis not present

## 2020-01-01 DIAGNOSIS — H35033 Hypertensive retinopathy, bilateral: Secondary | ICD-10-CM | POA: Diagnosis not present

## 2020-01-01 DIAGNOSIS — Z961 Presence of intraocular lens: Secondary | ICD-10-CM | POA: Diagnosis not present

## 2020-01-03 DIAGNOSIS — H59032 Cystoid macular edema following cataract surgery, left eye: Secondary | ICD-10-CM | POA: Diagnosis not present

## 2020-01-03 DIAGNOSIS — H43813 Vitreous degeneration, bilateral: Secondary | ICD-10-CM | POA: Diagnosis not present

## 2020-01-03 DIAGNOSIS — H35033 Hypertensive retinopathy, bilateral: Secondary | ICD-10-CM | POA: Diagnosis not present

## 2020-01-03 DIAGNOSIS — H35361 Drusen (degenerative) of macula, right eye: Secondary | ICD-10-CM | POA: Diagnosis not present

## 2020-01-09 DIAGNOSIS — E1169 Type 2 diabetes mellitus with other specified complication: Secondary | ICD-10-CM | POA: Diagnosis not present

## 2020-01-09 DIAGNOSIS — D860 Sarcoidosis of lung: Secondary | ICD-10-CM | POA: Diagnosis not present

## 2020-01-09 DIAGNOSIS — M791 Myalgia, unspecified site: Secondary | ICD-10-CM | POA: Diagnosis not present

## 2020-01-09 DIAGNOSIS — I10 Essential (primary) hypertension: Secondary | ICD-10-CM | POA: Diagnosis not present

## 2020-01-09 DIAGNOSIS — E7800 Pure hypercholesterolemia, unspecified: Secondary | ICD-10-CM | POA: Diagnosis not present

## 2020-03-05 DIAGNOSIS — R748 Abnormal levels of other serum enzymes: Secondary | ICD-10-CM | POA: Diagnosis not present

## 2020-03-05 DIAGNOSIS — E663 Overweight: Secondary | ICD-10-CM | POA: Diagnosis not present

## 2020-03-05 DIAGNOSIS — M791 Myalgia, unspecified site: Secondary | ICD-10-CM | POA: Diagnosis not present

## 2020-03-05 DIAGNOSIS — Z6825 Body mass index (BMI) 25.0-25.9, adult: Secondary | ICD-10-CM | POA: Diagnosis not present

## 2020-03-30 DIAGNOSIS — H59032 Cystoid macular edema following cataract surgery, left eye: Secondary | ICD-10-CM | POA: Diagnosis not present

## 2020-03-30 DIAGNOSIS — H35361 Drusen (degenerative) of macula, right eye: Secondary | ICD-10-CM | POA: Diagnosis not present

## 2020-03-30 DIAGNOSIS — H35033 Hypertensive retinopathy, bilateral: Secondary | ICD-10-CM | POA: Diagnosis not present

## 2020-03-30 DIAGNOSIS — H43813 Vitreous degeneration, bilateral: Secondary | ICD-10-CM | POA: Diagnosis not present

## 2020-04-10 DIAGNOSIS — K219 Gastro-esophageal reflux disease without esophagitis: Secondary | ICD-10-CM | POA: Diagnosis not present

## 2020-04-10 DIAGNOSIS — Z86010 Personal history of colonic polyps: Secondary | ICD-10-CM | POA: Diagnosis not present

## 2020-04-20 ENCOUNTER — Ambulatory Visit (INDEPENDENT_AMBULATORY_CARE_PROVIDER_SITE_OTHER): Payer: Medicare Other | Admitting: Neurology

## 2020-04-20 ENCOUNTER — Other Ambulatory Visit: Payer: Self-pay

## 2020-04-20 ENCOUNTER — Encounter: Payer: Self-pay | Admitting: Neurology

## 2020-04-20 VITALS — BP 155/72 | HR 92 | Resp 18 | Ht 65.0 in | Wt 155.0 lb

## 2020-04-20 DIAGNOSIS — M62838 Other muscle spasm: Secondary | ICD-10-CM

## 2020-04-20 DIAGNOSIS — R292 Abnormal reflex: Secondary | ICD-10-CM

## 2020-04-20 DIAGNOSIS — R748 Abnormal levels of other serum enzymes: Secondary | ICD-10-CM

## 2020-04-20 MED ORDER — TIZANIDINE HCL 2 MG PO TABS: 2.0000 mg | ORAL_TABLET | Freq: Every evening | ORAL | 0 refills | Status: DC | PRN

## 2020-04-20 NOTE — Patient Instructions (Addendum)
Check labs MRI cervical spine wo contrast Nerve and muscle testing of the right arm and leg.  Do not apply oil or moisturizer to your skin on the day of testing We will request labs from [facility] Start tizanidine 2mg  at bedtime  ELECTROMYOGRAM AND NERVE CONDUCTION STUDIES (EMG/NCS) INSTRUCTIONS  How to Prepare The neurologist conducting the EMG will need to know if you have certain medical conditions. Tell the neurologist and other EMG lab personnel if you: . Have a pacemaker or any other electrical medical device . Take blood-thinning medications . Have hemophilia, a blood-clotting disorder that causes prolonged bleeding Bathing Take a shower or bath shortly before your exam in order to remove oils from your skin. Don't apply lotions or creams before the exam.  What to Expect You'll likely be asked to change into a hospital gown for the procedure and lie down on an examination table. The following explanations can help you understand what will happen during the exam.  . Electrodes. The neurologist or a technician places surface electrodes at various locations on your skin depending on where you're experiencing symptoms. Or the neurologist may insert needle electrodes at different sites depending on your symptoms.  . Sensations. The electrodes will at times transmit a tiny electrical current that you may feel as a twinge or spasm. The needle electrode may cause discomfort or pain that usually ends shortly after the needle is removed. If you are concerned about discomfort or pain, you may want to talk to the neurologist about taking a short break during the exam.  . Instructions. During the needle EMG, the neurologist will assess whether there is any spontaneous electrical activity when the muscle is at rest - activity that isn't present in healthy muscle tissue - and the degree of activity when you slightly contract the muscle.  He or she will give you instructions on resting and  contracting a muscle at appropriate times. Depending on what muscles and nerves the neurologist is examining, he or she may ask you to change positions during the exam.  After your EMG You may experience some temporary, minor bruising where the needle electrode was inserted into your muscle. This bruising should fade within several days. If it persists, contact your primary care doctor.   We have sent a referral to [place] for your MRI and they will call you directly to schedule your appointment. They are located at [place]. If you need to contact them directly please call [phone].  Your provider has requested that you have labwork completed today. Please go to [facility] ([id number]) on the second floor of this building before leaving the office today. You do not need to check in. If you are not called within 15 minutes please check with the front desk.

## 2020-04-20 NOTE — Progress Notes (Signed)
[facility] Clinic Note - Initial Visit   Date: 04/20/20  [name] MRN: [id number] DOB: 25-Dec-1940   Dear Dr. Delfina Redwood:  Thank you for your kind referral of [name] for consultation of muscle cramps. Although her history is well known to you, please allow [place] to reiterate it for the purpose of our medical record. The patient was accompanied to the clinic by self.   History of Present Illness: [name] is a 79 y.o. right-handed female with hypertension and diabetes mellitus presenting for evaluation of muscle cramps.  Starting in around June 2021, she began having left thigh muscle spasms and sensation that the muscle is moving under her skin.  She does not observe muscle twitching.  Mornings are especially difficult because of generalized stiffness in the legs, for example, she feels as if she could get stuck, if she tried to bend down.  Prolonged sitting, standing, and cold temperatures cause her legs to feel tight.  She does not have symptoms when active, such as walking.   She is able to climb stairs and get out of a chair without difficulty.  No falls, dark colored urine.  She saw her PCP where CK level was found to be elevated [phone]. She was referred to rheumatology whose evaluation was negative.   Symptoms have improved over the past month, occurring less frequently. No medication changes or preceding illness. She is not on statin therapy.  COVID vaccine received in Jan/Feb 2021.   Labs 01/09/20:  Aldolase 6.4, CK 991, HbA1c 6.4, TSH 2.53    Past Medical History:  Diagnosis Date  . Diabetes mellitus without complication ([place])   . Hypertension   . Sarcoidosis     Past Surgical History:  Procedure Laterality Date  . ABDOMINAL HYSTERECTOMY    . ROTATOR CUFF REPAIR    . TRIGGER FINGER RELEASE       Medications:  Outpatient Encounter Medications as of 04/20/2020  Medication Sig  . amLODipine (NORVASC) 5 MG tablet Take 5 mg  by mouth daily.  Marland Kitchen aspirin 81 MG EC tablet Take 81 mg by mouth daily. Swallow whole.  . chlorthalidone (HYGROTON) 25 MG tablet Take 25 mg by mouth daily.  . metFORMIN (GLUCOPHAGE) 1000 MG tablet Take 1,000 mg by mouth daily with breakfast.    No facility-administered encounter medications on file as of 04/20/2020.    Allergies:  Allergies  Allergen Reactions  . Centrum Silver [Actical]     Rash   . Cozaar [Losartan]     Cough, muscle aches, and stomach cramps  . Crestor [Rosuvastatin]     NVD  . Lipitor [Atorvastatin]     Rash   . Protonix [Pantoprazole]     Back pain  . Simvastatin     Rash   . Sulfa Antibiotics     Unsure of side effects  . Zetia [Ezetimibe]     Nausea  . Multivitamin+ [Multiple Vitamin] Rash    Family History: No family history on file.  Social History: Social History   Tobacco Use  . Smoking status: Never Smoker  . Smokeless tobacco: Never Used  Vaping Use  . Vaping Use: Never used  Substance Use Topics  . Alcohol use: Never  . Drug use: Never   Social History   Social History Narrative   Right handed   One story home   Drinks caffeine    Vital Signs:  BP (!) 155/72   Pulse 92   Resp 18  Ht 5\' 5"  (1.651 m)   Wt 155 lb (70.3 kg)   SpO2 99%   BMI 25.79 kg/m   Neurological Exam: MENTAL STATUS including orientation to time, place, person, recent and remote memory, attention span and concentration, language, and fund of knowledge is normal.  Speech is not dysarthric.  CRANIAL NERVES: II:  No visual field defects.  .   III-IV-VI: Pupils equal round and reactive to light.  Normal conjugate, extra-ocular eye movements in all directions of gaze.  No nystagmus.  No ptosis.   V:  Normal facial sensation.    VII:  Normal facial symmetry and movements.   VIII:  Normal hearing and vestibular function.   IX-X:  Normal palatal movement.   XI:  Normal shoulder shrug and head rotation.   XII:  Normal tongue strength and range of motion,  no deviation or fasciculation.  MOTOR:  No atrophy, fasciculations or abnormal movements.  No pronator drift. No grip or percussion myotonia  Upper Extremity:  Right  Left  Deltoid  5/5   5/5   Biceps  5/5   5/5   Triceps  5/5   5/5   Infraspinatus 5/5  5/5  Medial pectoralis 5/5  5/5  Wrist extensors  5/5   5/5   Wrist flexors  5/5   5/5   Finger extensors  5/5   5/5   Finger flexors  5/5   5/5   Dorsal interossei  5/5   5/5   Abductor pollicis  5/5   5/5   Tone (Ashworth scale)  0  0   Lower Extremity:  Right  Left  Hip flexors  5/5   5/5   Hip extensors  5/5   5/5   Adductor 5/5  5/5  Abductor 5/5  5/5  Knee flexors  5/5   5/5   Knee extensors  5/5   5/5   Dorsiflexors  5/5   5/5   Plantarflexors  5/5   5/5   Toe extensors  5/5   5/5   Toe flexors  5/5   5/5   Tone (Ashworth scale)  0  0   MSRs:  Right        Left                  brachioradialis 3+  3+  biceps 3+  3+  triceps 3+  3+  patellar 2+  2+  ankle jerk 2+  2+  Hoffman no  no  plantar response down  down   SENSORY:  Normal and symmetric perception of light touch, pinprick, vibration, and proprioception.  Romberg's sign absent.   COORDINATION/GAIT: Normal finger-to- nose-finger and heel-to-shin.  Intact rapid alternating movements bilaterally.  Able to rise from a chair without using arms.  Gait narrow based and stable. Tandem and stressed gait intact.    IMPRESSION: 1.  HyperCKemia, unclear etiology  No evidence of motor weakness on exam  I will request labs from [facility] to see her autoimmune work-up  It's possible elevated CK maybe related to her muscle cramps.  EMG will be telling 2.  Hyperreflexia   History of cervical canal stenosis which can cause this, however with her new muscle cramps, I will check for cervical myelopathy and keep Stiffperson syndrome has a consideration.  PLAN/RECOMMENDATIONS:  Check CK, aldolase, anti-GAD65 MRI cervical spine wo contrast NCS/EMG of the right arm  and leg We will request labs from [facility] Start tizanidine 2mg  at bedtime  Further recommendations pending results.  Total time spent reviewing records, interview, history/exam, documentation, and coordination of care on day of encounter:  60 min    Thank you for allowing me to participate in patient's care.  If I can answer any additional questions, I would be pleased to do so.    Sincerely,    Andreika Vandagriff K. Posey Pronto, DO

## 2020-04-24 DIAGNOSIS — Z23 Encounter for immunization: Secondary | ICD-10-CM | POA: Diagnosis not present

## 2020-04-24 DIAGNOSIS — H9209 Otalgia, unspecified ear: Secondary | ICD-10-CM | POA: Diagnosis not present

## 2020-05-10 ENCOUNTER — Ambulatory Visit
Admission: RE | Admit: 2020-05-10 | Discharge: 2020-05-10 | Disposition: A | Discharge status: Home / Self Care | Payer: Medicare Other | Source: Ambulatory Visit | Attending: Neurology | Admitting: Neurology

## 2020-05-10 DIAGNOSIS — M4802 Spinal stenosis, cervical region: Secondary | ICD-10-CM | POA: Diagnosis not present

## 2020-05-10 DIAGNOSIS — M62838 Other muscle spasm: Secondary | ICD-10-CM

## 2020-05-10 DIAGNOSIS — R292 Abnormal reflex: Secondary | ICD-10-CM

## 2020-05-10 DIAGNOSIS — R748 Abnormal levels of other serum enzymes: Secondary | ICD-10-CM

## 2020-05-13 ENCOUNTER — Other Ambulatory Visit: Payer: Self-pay

## 2020-05-13 ENCOUNTER — Ambulatory Visit (INDEPENDENT_AMBULATORY_CARE_PROVIDER_SITE_OTHER): Payer: Medicare Other | Admitting: Neurology

## 2020-05-13 DIAGNOSIS — R748 Abnormal levels of other serum enzymes: Secondary | ICD-10-CM

## 2020-05-13 DIAGNOSIS — M62838 Other muscle spasm: Secondary | ICD-10-CM

## 2020-05-13 DIAGNOSIS — R292 Abnormal reflex: Secondary | ICD-10-CM | POA: Diagnosis not present

## 2020-05-13 NOTE — Procedures (Signed)
[facility]  [place], [place]  [place], [place] [address] Tel: [phone] Fax:  [phone] Test Date:  05/13/2020  Patient: [name] DOB: February 28, 1941 Physician: Narda Amber, DO  Sex: Female Height: 5\' 5"  Ref Phys: Narda Amber, DO  ID#: [id number] Temp: 32.0C Technician:    Patient Complaints: This is a 79 year old female referred for evaluation of hyperCKemia and muscle cramps.  NCV & EMG Findings: Extensive electrodiagnostic testing of the right upper and lower extremity shows:  1. Right median and ulnar sensory responses are within normal limits.  Right sural and superficial peroneal sensory responses are absent. 2. All motor responses including the right median, ulnar, peroneal, and tibial nerves are within normal limits. 3. Right tibial H reflex study is within normal limits. 4. There is no evidence of active or chronic motor axonal loss changes affecting any of the tested muscles.  Motor unit configuration and recruitment pattern is within normal limits.  Impression: 1. 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 2. There is no evidence of any diffuse myopathy or cervical/lumbosacral radiculopathy affecting the right side.   ___________________________ Narda Amber, DO    Nerve Conduction Studies Anti Sensory Summary Table   Stim Site NR Peak (ms) Norm Peak (ms) P-T Amp (V) Norm P-T Amp  Right Median Anti Sensory (2nd Digit)  32C  Wrist    3.2 <3.8 15.0 >10  Right Sup Peroneal Anti Sensory (Ant Lat Mall)  32C  12 cm NR  <4.6  >3  Right Sural Anti Sensory (Lat Mall)  32C  Calf NR  <4.6  >3  Right Ulnar Anti Sensory (5th Digit)  32C  Wrist    2.8 <3.2 23.2 >5   Motor Summary Table   Stim Site NR Onset (ms) Norm Onset (ms) O-P Amp (mV) Norm O-P Amp Site1 Site2 Delta-0 (ms) Dist (cm) Vel (m/s) Norm Vel (m/s)  Right  Motor (Abd Poll Brev)  32C  Wrist    3.3  <4.0 7.7 >5 Elbow Wrist 5.3 28.0 53 >50  Elbow    8.6  7.7         Right Peroneal Motor (Ext Dig Brev)  32C  Ankle    3.1 <6.0 3.6 >2.5 B Fib Ankle 6.9 38.0 55 >40  B Fib    10.0  2.9  Poplt B Fib 1.5 8.0 53 >40  Poplt    11.5  2.8         Right Peroneal TA Motor (Tib Ant)  32C  Fib Head    2.6 <4.5 5.8 >3 Poplit Fib Head 1.4 8.0 57 >40  Poplit    4.0  5.4         Right Tibial Motor (Abd Hall Brev)  32C  Ankle    3.6 <6.0 8.5 >4 Knee Ankle 7.4 41.0 55 >40  Knee    11.0  7.0         Right Ulnar Motor (Abd Dig Minimi)  32C  Wrist    2.7 <3.1 8.3 >7 B Elbow Wrist 4.1 25.0 61 >50  B Elbow    6.8  7.1  A Elbow B Elbow 1.5 10.0 67 >50  A Elbow    8.3  7.0          H Reflex Studies   NR H-Lat (ms) Lat Norm (ms) L-R H-Lat (ms)  Right Tibial (Gastroc)  32C     34.56 <35  EMG   Side Muscle Ins Act Fibs Psw Fasc Number Recrt Dur Dur. Amp Amp. Poly Poly. Comment  Right 1stDorInt Nml Nml Nml Nml Nml Nml Nml Nml Nml Nml Nml Nml N/A  Right PronatorTeres Nml Nml Nml Nml Nml Nml Nml Nml Nml Nml Nml Nml N/A  Right Biceps Nml Nml Nml Nml Nml Nml Nml Nml Nml Nml Nml Nml N/A  Right Triceps Nml Nml Nml Nml Nml Nml Nml Nml Nml Nml Nml Nml N/A  Right Deltoid Nml Nml Nml Nml Nml Nml Nml Nml Nml Nml Nml Nml N/A  Right AntTibialis Nml Nml Nml Nml Nml Nml Nml Nml Nml Nml Nml Nml N/A  Right Gastroc Nml Nml Nml Nml Nml Nml Nml Nml Nml Nml Nml Nml N/A  Right Flex Dig Long Nml Nml Nml Nml Nml Nml Nml Nml Nml Nml Nml Nml N/A  Right RectFemoris Nml Nml Nml Nml Nml Nml Nml Nml Nml Nml Nml Nml N/A  Right GluteusMed Nml Nml Nml Nml Nml Nml Nml Nml Nml Nml Nml Nml N/A      Waveforms:

## 2020-05-13 NOTE — Addendum Note (Signed)
Addended by: Armen Pickup A on: 05/13/2020 03:31 PM   Modules accepted: Orders

## 2020-05-13 NOTE — Progress Notes (Signed)
Follow-up Visit   Date: 05/13/20   [name] MRN: [id number] DOB: [birth year]   Interim History: [name] is a 79 y.o. right-handed female with diabetes mellitus and hypertension returning to the clinic for EDX and discuss results.   She continues to have muscle cramps and spasm in the arms and legs which is worse when she is at rest.She has not tried tizanidine, since symptoms have not been as severe.  She had MRI cervical spine which shows cervical canal stenosis at C4-5, and stable prior ACDF at C5-6.   Medications:  Current Outpatient Medications on File Prior to Visit  Medication Sig Dispense Refill  . amLODipine (NORVASC) 5 MG tablet Take 5 mg by mouth daily.    Marland Kitchen aspirin 81 MG EC tablet Take 81 mg by mouth daily. Swallow whole.    . chlorthalidone (HYGROTON) 25 MG tablet Take 25 mg by mouth daily.    . metFORMIN (GLUCOPHAGE) 1000 MG tablet Take 1,000 mg by mouth daily with breakfast.     . tiZANidine (ZANAFLEX) 2 MG tablet Take 1 tablet (2 mg total) by mouth at bedtime as needed for muscle spasms. 30 tablet 0   No current facility-administered medications on file prior to visit.    Allergies:  Allergies  Allergen Reactions  . Centrum Silver [Actical]     Rash   . Cozaar [Losartan]     Cough, muscle aches, and stomach cramps  . Crestor [Rosuvastatin]     NVD  . Lipitor [Atorvastatin]     Rash   . Protonix [Pantoprazole]     Back pain  . Simvastatin     Rash   . Sulfa Antibiotics     Unsure of side effects  . Zetia [Ezetimibe]     Nausea  . Multivitamin+ [Multiple Vitamin] Rash    Neurological Exam: Deferred  Data: MRI cervical spine wo contrast 05/11/2020: 1. Prior ACDF at C5-6 without residual or recurrent stenosis. 2. Adjacent segment disease with progressive 3 mm retrolisthesis of C4 on C5 with resultant moderate spinal stenosis, with moderate to severe left worse than right C5 foraminal stenosis. 3. Left eccentric disc osteophyte at  C3-4 with resultant mild canal and bilateral C4 foraminal stenosis.  NCS/EMG of the right arm and leg 05/13/2020: 1. 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 2. There is no evidence of any diffuse myopathy or cervical/lumbosacral radiculopathy affecting the right side.  IMPRESSION/PLAN: 1.  Cervical spondylosis with spinal canal stenosis at C4-5, above the level of her prior fusion.  This is most likely contributing to her muscle cramps and stiffness, as EMG does not show any signs of myopathy. Start neck PT Discussed seeing a spine specialist, but since she does not have weakness or arm paresthesias, will continue to monitor  2.  Peripheral neuropathy manifesting with mild distal paresthesias, contributed by diabetes  3.  Muscle cramps due to #1 Continue tizanidine 2mg  at bedtime as needed  4. HyperCKemia.  EMG is normal.  Possible this is benign elevation.  Check CK and aldolase, she will have this drawn when she sees her PCP next month   Return to clinic in 3.   Total time spent reviewing records, interview, history/exam, documentation, and coordination of care on day of encounter:  15 min     Thank you for allowing me to participate in patient's care.  If I can answer any additional questions, I would be pleased to  do so.    Sincerely,    Stacey Sago K. Posey Pronto, DO

## 2020-05-15 ENCOUNTER — Other Ambulatory Visit: Payer: Self-pay | Admitting: Neurology

## 2020-05-18 NOTE — Telephone Encounter (Signed)
Okay to RF.  #30.  RF 0

## 2020-05-28 ENCOUNTER — Other Ambulatory Visit: Payer: Self-pay | Admitting: Neurology

## 2020-05-28 DIAGNOSIS — Z1159 Encounter for screening for other viral diseases: Secondary | ICD-10-CM | POA: Diagnosis not present

## 2020-05-28 NOTE — Telephone Encounter (Signed)
Pharmacy requesting 90 day supply of Tizanidine.

## 2020-05-29 DIAGNOSIS — M79601 Pain in right arm: Secondary | ICD-10-CM | POA: Diagnosis not present

## 2020-05-29 DIAGNOSIS — M542 Cervicalgia: Secondary | ICD-10-CM | POA: Diagnosis not present

## 2020-06-02 DIAGNOSIS — D125 Benign neoplasm of sigmoid colon: Secondary | ICD-10-CM | POA: Diagnosis not present

## 2020-06-02 DIAGNOSIS — D124 Benign neoplasm of descending colon: Secondary | ICD-10-CM | POA: Diagnosis not present

## 2020-06-02 DIAGNOSIS — D175 Benign lipomatous neoplasm of intra-abdominal organs: Secondary | ICD-10-CM | POA: Diagnosis not present

## 2020-06-02 DIAGNOSIS — K640 First degree hemorrhoids: Secondary | ICD-10-CM | POA: Diagnosis not present

## 2020-06-02 DIAGNOSIS — D120 Benign neoplasm of cecum: Secondary | ICD-10-CM | POA: Diagnosis not present

## 2020-06-02 DIAGNOSIS — Z86010 Personal history of colonic polyps: Secondary | ICD-10-CM | POA: Diagnosis not present

## 2020-06-02 DIAGNOSIS — D122 Benign neoplasm of ascending colon: Secondary | ICD-10-CM | POA: Diagnosis not present

## 2020-06-04 DIAGNOSIS — M79601 Pain in right arm: Secondary | ICD-10-CM | POA: Diagnosis not present

## 2020-06-04 DIAGNOSIS — M542 Cervicalgia: Secondary | ICD-10-CM | POA: Diagnosis not present

## 2020-06-05 DIAGNOSIS — D124 Benign neoplasm of descending colon: Secondary | ICD-10-CM | POA: Diagnosis not present

## 2020-06-05 DIAGNOSIS — D122 Benign neoplasm of ascending colon: Secondary | ICD-10-CM | POA: Diagnosis not present

## 2020-06-05 DIAGNOSIS — D120 Benign neoplasm of cecum: Secondary | ICD-10-CM | POA: Diagnosis not present

## 2020-06-05 DIAGNOSIS — D125 Benign neoplasm of sigmoid colon: Secondary | ICD-10-CM | POA: Diagnosis not present

## 2020-06-08 DIAGNOSIS — Z1231 Encounter for screening mammogram for malignant neoplasm of breast: Secondary | ICD-10-CM | POA: Diagnosis not present

## 2020-06-09 DIAGNOSIS — M79601 Pain in right arm: Secondary | ICD-10-CM | POA: Diagnosis not present

## 2020-06-09 DIAGNOSIS — M542 Cervicalgia: Secondary | ICD-10-CM | POA: Diagnosis not present

## 2020-06-11 DIAGNOSIS — M542 Cervicalgia: Secondary | ICD-10-CM | POA: Diagnosis not present

## 2020-06-11 DIAGNOSIS — M79601 Pain in right arm: Secondary | ICD-10-CM | POA: Diagnosis not present

## 2020-06-15 DIAGNOSIS — M542 Cervicalgia: Secondary | ICD-10-CM | POA: Diagnosis not present

## 2020-06-15 DIAGNOSIS — M79601 Pain in right arm: Secondary | ICD-10-CM | POA: Diagnosis not present

## 2020-06-18 DIAGNOSIS — M79601 Pain in right arm: Secondary | ICD-10-CM | POA: Diagnosis not present

## 2020-06-18 DIAGNOSIS — M542 Cervicalgia: Secondary | ICD-10-CM | POA: Diagnosis not present

## 2020-06-29 DIAGNOSIS — H59032 Cystoid macular edema following cataract surgery, left eye: Secondary | ICD-10-CM | POA: Diagnosis not present

## 2020-06-29 DIAGNOSIS — H35033 Hypertensive retinopathy, bilateral: Secondary | ICD-10-CM | POA: Diagnosis not present

## 2020-06-29 DIAGNOSIS — H43813 Vitreous degeneration, bilateral: Secondary | ICD-10-CM | POA: Diagnosis not present

## 2020-06-29 DIAGNOSIS — H35361 Drusen (degenerative) of macula, right eye: Secondary | ICD-10-CM | POA: Diagnosis not present

## 2020-07-14 DIAGNOSIS — R2230 Localized swelling, mass and lump, unspecified upper limb: Secondary | ICD-10-CM | POA: Diagnosis not present

## 2020-07-14 DIAGNOSIS — Z0000 Encounter for general adult medical examination without abnormal findings: Secondary | ICD-10-CM | POA: Diagnosis not present

## 2020-07-14 DIAGNOSIS — E1169 Type 2 diabetes mellitus with other specified complication: Secondary | ICD-10-CM | POA: Diagnosis not present

## 2020-07-14 DIAGNOSIS — D860 Sarcoidosis of lung: Secondary | ICD-10-CM | POA: Diagnosis not present

## 2020-07-14 DIAGNOSIS — E7800 Pure hypercholesterolemia, unspecified: Secondary | ICD-10-CM | POA: Diagnosis not present

## 2020-07-14 DIAGNOSIS — I10 Essential (primary) hypertension: Secondary | ICD-10-CM | POA: Diagnosis not present

## 2020-07-14 DIAGNOSIS — Z5181 Encounter for therapeutic drug level monitoring: Secondary | ICD-10-CM | POA: Diagnosis not present

## 2020-07-14 DIAGNOSIS — K219 Gastro-esophageal reflux disease without esophagitis: Secondary | ICD-10-CM | POA: Diagnosis not present

## 2020-08-17 ENCOUNTER — Other Ambulatory Visit: Payer: Self-pay

## 2020-08-17 ENCOUNTER — Telehealth (INDEPENDENT_AMBULATORY_CARE_PROVIDER_SITE_OTHER): Payer: Medicare Other | Admitting: Neurology

## 2020-08-17 DIAGNOSIS — R748 Abnormal levels of other serum enzymes: Secondary | ICD-10-CM | POA: Diagnosis not present

## 2020-08-17 DIAGNOSIS — M47812 Spondylosis without myelopathy or radiculopathy, cervical region: Secondary | ICD-10-CM

## 2020-08-17 DIAGNOSIS — R292 Abnormal reflex: Secondary | ICD-10-CM | POA: Diagnosis not present

## 2020-08-17 DIAGNOSIS — M62838 Other muscle spasm: Secondary | ICD-10-CM

## 2020-08-17 NOTE — Progress Notes (Addendum)
   Virtual Visit via Virtual Note The purpose of this virtual visit is to provide medical care while limiting exposure to the novel coronavirus.  Patient was unable to connect via video, so transitioned to telephone visit.   Consent was obtained for video visit:  Yes.   Answered questions that patient had about telehealth interaction:  Yes.   I discussed the limitations, risks, security and privacy concerns of performing an evaluation and management service by telemedicine. I also discussed with the patient that there may be a patient responsible charge related to this service. The patient expressed understanding and agreed to proceed.  Pt location: Home Physician Location: office Name of referring provider:  Seward Carol, MD I connected with Mechele Dawley at patients initiation/request on 08/17/2020 at  8:30 AM EST by video enabled telemedicine application and verified that I am speaking with the correct person using two identifiers. Pt MRN:  [id number] Pt DOB:  1941/04/19 Video Participants:  Mechele Dawley   History of Present Illness: This is a 80 y.o. female returning for follow-up of cervical canal stenosis at C4-5, hyperCKemia, and muscle cramps.  Her MRI cervical spine showed canal stenosis at C4-5 above her prior surgery.  She completed neck PT and was doing very well until she received her booster in December, which worsened her muscle cramps.  Her lower legs no longer get stiff or tense, but she continues to have some pain in the thighs and back.  She denies weakness, numbness/tingling, imbalance, or falls.  She was not able to get repeat labs to recheck CK.  Overall, she feels much better than before, especially with upper body symptoms.    Observations/Objective:   There were no vitals filed for this visit.  BP 120/70   Assessment and Plan:  1.  Cervical canal stenosis at C4-5 above the level of her prior fusion.   - She has completed PT which helped her muscle spasms   -  Continue home PT exercises   - Previously discussed seeing spine specialist, only if symptoms get worse  2.  HyperCKemia, benign  - No evidence of myopathy on EMG  - Rheumatological evaluation has been negative  - Continue to follow, repeat CK at next visit  3.  Muscle cramps, likely due to #1  - If symptoms progress, check anti-GAD antibody  - Tizanidine 2mg  at bedtime, as needed  Follow Up Instructions:   I discussed the assessment and treatment plan with the patient. The patient was provided an opportunity to ask questions and all were answered. The patient agreed with the plan and demonstrated an understanding of the instructions.   The patient was advised to call back or seek an in-person evaluation if the symptoms worsen or if the condition fails to improve as anticipated.  Follow-up in 3 months    Time spent:  15 min  [place], DO

## 2020-08-17 NOTE — Addendum Note (Signed)
Addended by: Alda Berthold on: 08/17/2020 11:11 AM   Modules accepted: Level of Service

## 2020-09-28 DIAGNOSIS — H59032 Cystoid macular edema following cataract surgery, left eye: Secondary | ICD-10-CM | POA: Diagnosis not present

## 2020-09-28 DIAGNOSIS — H35033 Hypertensive retinopathy, bilateral: Secondary | ICD-10-CM | POA: Diagnosis not present

## 2020-09-28 DIAGNOSIS — H35361 Drusen (degenerative) of macula, right eye: Secondary | ICD-10-CM | POA: Diagnosis not present

## 2020-09-28 DIAGNOSIS — H43813 Vitreous degeneration, bilateral: Secondary | ICD-10-CM | POA: Diagnosis not present

## 2020-10-06 DIAGNOSIS — M25531 Pain in right wrist: Secondary | ICD-10-CM | POA: Diagnosis not present

## 2020-11-18 NOTE — Progress Notes (Signed)
Follow-up Visit   Date: 11/19/20   [name] MRN: [id number] DOB: [birth year]   Interim History: [name] is a 80 y.o. right-handed female returning to the clinic for follow-up of cervical canal stenosis at C4-5, hyperCKemia, and muscle cramps.  The patient was accompanied to the clinic by self.   History of present illness: Starting in around June 2021, she began having left thigh muscle spasms and sensation that the muscle is moving under her skin.  She does not observe muscle twitching.  Mornings are especially difficult because of generalized stiffness in the legs, for example, she feels as if she could get stuck, if she tried to bend down.  Prolonged sitting, standing, and cold temperatures cause her legs to feel tight.  She does not have symptoms when active, such as walking.   She is able to climb stairs and get out of a chair without difficulty.  No falls, dark colored urine.  She saw her PCP where CK level was found to be elevated [phone]. She was referred to rheumatology whose evaluation was negative.   Symptoms have improved over the past month, occurring less frequently. No medication changes or preceding illness. She is not on statin therapy.  COVID vaccine received in Jan/Feb 2021.   UPDATE 11/19/2020:  She was doing well until a few week ago, when she began having arm and leg muscle cramps.  She did stretching exercises which alleviated her arm spasms, but she continues to have it in th legs.  She denies weakness or falls.   She continues to have tightness and leg stiffness in the thigh upon waking up in the morning.  No significant pain. Once she has been moving around for a while, the stiffness and muscle tightness improves.     Medications:  Current Outpatient Medications on File Prior to Visit  Medication Sig Dispense Refill  . amLODipine (NORVASC) 5 MG tablet Take 5 mg by mouth daily.    Marland Kitchen aspirin 81 MG EC tablet Take 81 mg by mouth daily. Swallow  whole.    . chlorthalidone (HYGROTON) 25 MG tablet Take 25 mg by mouth daily.    . metFORMIN (GLUCOPHAGE) 1000 MG tablet Take 1,000 mg by mouth daily with breakfast.     . Multiple Vitamins-Minerals (HAIR SKIN AND NAILS FORMULA PO) Take by mouth.    Marland Kitchen tiZANidine (ZANAFLEX) 2 MG tablet TAKE 1 TABLET BY MOUTH AT BEDTIME AS NEEDED FOR MUSCLE SPASMS. (Patient not taking: No sig reported) 90 tablet 3   No current facility-administered medications on file prior to visit.    Allergies:  Allergies  Allergen Reactions  . Centrum Silver [Actical]     Rash   . Cozaar [Losartan]     Cough, muscle aches, and stomach cramps  . Crestor [Rosuvastatin]     NVD  . Lipitor [Atorvastatin]     Rash   . Protonix [Pantoprazole]     Back pain  . Simvastatin     Rash   . Sulfa Antibiotics     Unsure of side effects  . Zetia [Ezetimibe]     Nausea  . Multivitamin+ [Multiple Vitamin] Rash    Vital Signs:  BP (!) 144/76   Pulse 85   Ht 5' 5.5" (1.664 m)   Wt 157 lb 6.4 oz (71.4 kg)   SpO2 99%   BMI 25.79 kg/m   Neurological Exam: MENTAL STATUS including orientation to time, place, person, recent and remote memory, attention span and  concentration, language, and fund of knowledge is normal.  Speech is not dysarthric.  CRANIAL NERVES:  No visual field defects.  Pupils equal round and reactive to light.  Normal conjugate, extra-ocular eye movements in all directions of gaze.  No ptosis.  Face is symmetric.   MOTOR:  Motor strength is 5/5 in all extremities.  No atrophy, fasciculations or abnormal movements.  No pronator drift.  Tone is normal.    MSRs:  Reflexes are 3+/4 throughout, except absent at the ankles.  SENSORY: Vibration is reduced at the ankles bilaterally.  COORDINATION/GAIT:    Intact rapid alternating movements bilaterally.  Gait narrow based and stable.   Data: NCS/EMG of the right arm and leg 05/13/2020: 1. The electrophysiologic findings are consistent with a sensory  axonal polyneuropathy affecting the right lower extremity. 2. There is no evidence of any diffuse myopathy or cervical/lumbosacral radiculopathy affecting the right side.  MRI cervical spine 05/10/2020: 1. Prior ACDF at C5-6 without residual or recurrent stenosis. 2. Adjacent segment disease with progressive 3 mm retrolisthesis of C4 on C5 with resultant moderate spinal stenosis, with moderate to severe left worse than right C5 foraminal stenosis. 3. Left eccentric disc osteophyte at C3-4 with resultant mild canal and bilateral C4 foraminal stenosis.   IMPRESSION/PLAN: 1.  Muscle cramps, myalgia, leg stiffness in the setting of benign hyperckemia (CK [id number])  I will check labs for Stiffperson syndrome.  Rheumatology evaluation for hyperCKemia has been unremarkable.  - EMG without evidence of myopathy  - Check CK, aldolase, anti-GAD antibody  - Start PT for leg stretching  3.  Cervical canal stenosis at C4-5 above the level of her prior fusion  - Completed PT, continue home exercises  - No neck pain or arm paresthesias/weakness  3.  Neuropathy, diabetic  - Check ESR, CRP, vitamin B12, folate, SPEP with [facility]  - Patient educated on daily foot inspection, fall prevention, and safety precautions around the home.  Return to clinic in 3 months.    Thank you for allowing me to participate in patient's care.  If I can answer any additional questions, I would be pleased to do so.    Sincerely,    Quaid Yeakle K. Posey Pronto, DO

## 2020-11-19 ENCOUNTER — Other Ambulatory Visit: Payer: Self-pay

## 2020-11-19 ENCOUNTER — Ambulatory Visit (INDEPENDENT_AMBULATORY_CARE_PROVIDER_SITE_OTHER): Payer: Medicare Other | Admitting: Neurology

## 2020-11-19 ENCOUNTER — Encounter: Payer: Self-pay | Admitting: Neurology

## 2020-11-19 ENCOUNTER — Other Ambulatory Visit (INDEPENDENT_AMBULATORY_CARE_PROVIDER_SITE_OTHER): Payer: Medicare Other

## 2020-11-19 VITALS — BP 144/76 | HR 85 | Ht 65.5 in | Wt 157.4 lb

## 2020-11-19 DIAGNOSIS — G629 Polyneuropathy, unspecified: Secondary | ICD-10-CM

## 2020-11-19 DIAGNOSIS — M4802 Spinal stenosis, cervical region: Secondary | ICD-10-CM

## 2020-11-19 DIAGNOSIS — R748 Abnormal levels of other serum enzymes: Secondary | ICD-10-CM

## 2020-11-19 DIAGNOSIS — R5383 Other fatigue: Secondary | ICD-10-CM

## 2020-11-19 DIAGNOSIS — R6889 Other general symptoms and signs: Secondary | ICD-10-CM | POA: Diagnosis not present

## 2020-11-19 DIAGNOSIS — M62838 Other muscle spasm: Secondary | ICD-10-CM | POA: Diagnosis not present

## 2020-11-19 LAB — CK: Total CK: 709 U/L — ABNORMAL HIGH (ref 7–177)

## 2020-11-19 LAB — SEDIMENTATION RATE: Sed Rate: 94 mm/hr — ABNORMAL HIGH (ref 0–30)

## 2020-11-19 LAB — FOLATE: Folate: 15.2 ng/mL (ref >5.9)

## 2020-11-19 LAB — VITAMIN B12: Vitamin B-12: 389 pg/mL (ref 211–911)

## 2020-11-19 LAB — C-REACTIVE PROTEIN: CRP: 1.7 mg/dL (ref 0.5–20.0)

## 2020-11-19 NOTE — Patient Instructions (Addendum)
Take tizanidine 2mg  at bedtime every night Check labs Start physical therapy for leg stretching and low back strengthening  Return to clinic in 3 months

## 2020-11-21 LAB — IMMUNOFIXATION, SERUM
IgA/Immunoglobulin A, Serum: 423 mg/dL — ABNORMAL HIGH (ref 64–422)
IgG (Immunoglobin G), Serum: 1424 mg/dL (ref 586–1602)
IgM (Immunoglobulin M), Srm: 121 mg/dL (ref 26–217)

## 2020-11-25 DIAGNOSIS — R42 Dizziness and giddiness: Secondary | ICD-10-CM | POA: Diagnosis not present

## 2020-11-26 LAB — PROTEIN ELECTROPHORESIS, SERUM
Albumin ELP: 4.1 g/dL (ref 3.8–4.8)
Alpha 1: 0.4 g/dL — ABNORMAL HIGH (ref 0.2–0.3)
Alpha 2: 1 g/dL — ABNORMAL HIGH (ref 0.5–0.9)
Beta 2: 0.5 g/dL (ref 0.2–0.5)
Beta Globulin: 0.4 g/dL (ref 0.4–0.6)
Gamma Globulin: 1.2 g/dL (ref 0.8–1.7)
Total Protein: 7.6 g/dL (ref 6.1–8.1)

## 2020-11-26 LAB — GAD65, IA-2, AND INSULIN AUTOANTIBODY SERUM
Glutamic Acid Decarb Ab: <5 IU/mL (ref <5)
IA-2 Antibody: <5.4 U/mL (ref <5.4)
Insulin Antibodies, Human: <0.4 U/mL (ref <0.4)

## 2020-11-26 LAB — ALDOLASE: Aldolase: 3.8 U/L (ref <=8.1)

## 2020-11-27 ENCOUNTER — Ambulatory Visit: Payer: Medicare Other

## 2020-12-01 ENCOUNTER — Ambulatory Visit: Payer: Medicare Other | Attending: Neurology

## 2020-12-01 ENCOUNTER — Other Ambulatory Visit: Payer: Self-pay

## 2020-12-01 DIAGNOSIS — R42 Dizziness and giddiness: Secondary | ICD-10-CM

## 2020-12-01 DIAGNOSIS — R262 Difficulty in walking, not elsewhere classified: Secondary | ICD-10-CM | POA: Diagnosis not present

## 2020-12-01 DIAGNOSIS — M6281 Muscle weakness (generalized): Secondary | ICD-10-CM | POA: Insufficient documentation

## 2020-12-01 DIAGNOSIS — R293 Abnormal posture: Secondary | ICD-10-CM | POA: Insufficient documentation

## 2020-12-02 NOTE — Therapy (Signed)
[facility] [address] [place], [place], [address] Phone: [phone]   Fax:  [phone]  Physical Therapy Evaluation  Patient Details  Name: [name] MRN: [id number] Date of Birth: Nov 28, 1940 Referring Provider (PT): Narda Amber   Encounter Date: 12/01/2020   PT End of Session - 12/01/20 0845    Visit Number 1    Number of Visits 6    Date for PT Re-Evaluation 42/59/56   60 day cert, 30 day poc   Authorization Type medicare so 10th visit progress note    PT Start Time 0844    PT Stop Time 0932    PT Time Calculation (min) 48 min    Activity Tolerance Patient tolerated treatment well    Behavior During Therapy [facility] for tasks assessed/performed           Past Medical History:  Diagnosis Date  . Diabetes mellitus without complication ([place])   . Hypertension   . Sarcoidosis     Past Surgical History:  Procedure Laterality Date  . ABDOMINAL HYSTERECTOMY    . ROTATOR CUFF REPAIR    . TRIGGER FINGER RELEASE      There were no vitals filed for this visit.    Subjective Assessment - 12/01/20 0845    Subjective Pt was referred for muscle spasms, cervical stenosis of spinal canal, neuopathy and hyperCKemia for leg and back stretching. Pt reports that she is having some muscle stiffness in legs around her bottom and front of thighs. Pt reports that in the mornings when she walks to the mailbox it really tightens up but when she keeps going it seems to go away. Prolonged sitting also seems to aggravate when first gets up. She reports after walking about 100 yards it improves. Left seems worse than the right. Does not happen all the time. No issues with back other than 40 years ago some LBP which she had therapy for. Pt also reports some vertigo when first lays down at night and when first sits up in morning. Noticed this when she was putting eye drops in at first but has since stopped the eye drops. The vertigo is  new since seeing Dr. Posey Pronto. She was prescribed meclizine from her primary.    Pertinent History muscle spasm, cervical stenosis of spinal canal, neuropathy, hyperCKemia    Patient Stated Goals Pt would like to decrease the stiffness in her legs to improve her function.    Currently in Pain? Yes    Pain Score 0-No pain   the worse the pain gets is 8/10   Pain Location Leg    Pain Orientation Left;Right    Pain Descriptors / Indicators Tightness    Pain Type Chronic pain    Pain Onset More than a month ago    Pain Frequency Intermittent    Aggravating Factors  prolonged sitting    Pain Relieving Factors walking              [facility] PT Assessment - 12/01/20 0857      Assessment   Medical Diagnosis muscle spasm, cervical stenosis, neuropathy    Referring Provider (PT) Narda Amber    Onset Date/Surgical Date 11/19/20    Hand Dominance Right      Balance Screen   Has the patient fallen in the past 6 months No    Has the patient had a decrease in activity level because of a fear of falling?  No    Is the patient  reluctant to leave their home because of a fear of falling?  No      Home Environment   Living Environment Private residence    Living Arrangements Alone    Type of [place] Access Level entry    [place] None      Prior Function   Level of Independence Independent;Independent with community mobility without device    Vocation Retired    Leisure walk, paint, gardening, volunteers for food pantry 1x/month      Sensation   Light Touch Appears Intact    Additional Comments intact to light touch in feet and legs but does report some tingling in feet at times. Sometimes feet feel like she is wearing socks when is not. Is diabetic and has neuropathy diagnosis      ROM / Strength   AROM / PROM / Strength PROM;AROM;Strength      AROM   AROM Assessment Site Lumbar    Lumbar Flexion WFL    Lumbar Extension WFL    Lumbar - Right  Side Bend to knee    Lumbar - Left Side Bend to knee    Lumbar - Right Rotation WFL    Lumbar - Left Rotation WFL      PROM   PROM Assessment Site Hip    Right/Left Hip Right;Left    Right Hip External Rotation  10    Right Hip Internal Rotation  16    Left Hip External Rotation  18    Left Hip Internal Rotation  18      Strength   Overall Strength Comments Strength in BLE hip flexion, knee flexion/ext, ankle DF grossly 4+/5 to 5/5. BUE strength for shoulder flexion/abd, elbow flex/ext 5/5.      Flexibility   Quadriceps decreased prone knee flexion bilateral to less than 90 degree with pelvis coming up right worse than left.      Special Tests    Special Tests Lumbar;Sacrolliac Tests    Other special tests Thomas test positive on right    Lumbar Tests Straight Leg Raise    Sacroiliac Tests  Sacral Thrust      Straight Leg Raise   Findings Negative      Pelvic Dictraction   Findings Negative      Pelvic Compression   Findings Negative      Sacral thrust    Findings Negative      Transfers   Transfers Sit to Stand;Stand to Sit    Sit to Stand 7: Independent    Stand to Sit 7: Independent      Ambulation/Gait   Ambulation/Gait Yes    Ambulation/Gait Assistance 7: Independent    Ambulation Distance (Feet) 1000 Feet    Assistive device None    Gait Pattern Step-through pattern;Decreased trunk rotation    Ambulation Surface Level;Indoor    Gait velocity 8.89 sec=1.53m/s    Gait Comments 6 min walk=1000' without AD. Pt has decreased trunk/pelvic rotation. No pain in legs as she went on with gait.      6 minute walk test results    Endurance additional comments 1000' with no AD. No pain in legs at end.                      Objective measurements completed on examination: See above findings.               PT  Education - 12/02/20 [phone]    Education Details PT plan of care    Person(s) Educated Patient    Methods Explanation    Comprehension  Verbalized understanding            PT Short Term Goals - 12/02/20 0833      PT SHORT TERM GOAL #1   Title STGs=LTGs             PT Long Term Goals - 12/02/20 5462      PT LONG TERM GOAL #1   Title Pt will be instructed in HEP for flexibility and ROM to better manage her tightness and improve her function.    Time 4    Period Weeks    Status New    Target Date 01/01/21      PT LONG TERM GOAL #2   Title Pt will report <6/10 pain/tightness in legs with activities for improved function.    Time 4    Period Weeks    Status New    Target Date 01/01/21      PT LONG TERM GOAL #3   Title Pt will increase 6 min walk to >1150' for improved community mobility.    Baseline 12/01/20 1000'    Time 4    Period Weeks    Status New    Target Date 01/01/21      PT LONG TERM GOAL #4   Title Vestibular assessment will be performed as warranted and goal written if needed.    Time 4    Period Weeks    Status New    Target Date 01/01/21                  Plan - 12/02/20 0820    Clinical Impression Statement Pt is 80 y/o female with history of muscle spasms, cervical stenosis of spinal canal, neuropathy, and hyperCKemia. She was referred to PT due to reports of tightness in anterior thigh and hips. Pt is independent with functional mobility. She reports tightness is variable but seems worse when first starts to move and does improve with movement especially walking. Pt was negative on all special tests for lumbar spine and SI joint. She had good strength in BLE. Was found to have a lot of tightness in bilateral hip right more so than left especially with hip IR and ER rotation. Also positive right Thomas test with hip flexor tightness as well as decreased prone knee bend bilateral. Pt ambulating with gait speed of 1.40m/s and had 6 min walk of 1000' with no pain at end. Pt also reported during eval some recent dizziness when lays back and first sits up since seeing Dr. Posey Pronto that PT  may further assess next visit.    Personal Factors and Comorbidities Comorbidity 3+    Comorbidities muscle spasm, cervical stenosis of spinal canal, neuropathy, hyperCKemia    Examination-Activity Limitations Locomotion Level    Examination-Participation Restrictions Community Activity;Volunteer    Stability/Clinical Decision Making Stable/Uncomplicated    Clinical Decision Making Low    Rehab Potential Good    PT Frequency 2x / week    PT Duration Other (comment)   x 1 week followed by 1x/week for 3 weeks   PT Treatment/Interventions Cryotherapy;Moist Heat;Therapeutic exercise;Neuromuscular re-education;Therapeutic activities;Functional mobility training;Gait training;Patient/family education;Manual techniques;Passive range of motion;Vestibular;Canalith Repostioning    PT Next Visit Plan Initiate HEP for hip flexibility and ROM especially hip IR/ER, hip flexor. Further vestibular assessment if needed.    Consulted and Agree  with Plan of Care Patient           Patient will benefit from skilled therapeutic intervention in order to improve the following deficits and impairments:  Impaired flexibility,Hypermobility,Pain,Dizziness,Increased muscle spasms,Abnormal gait  Visit Diagnosis: Difficulty in walking, not elsewhere classified  Abnormal posture  Muscle weakness (generalized)  Dizziness and giddiness     Problem List There are no problems to display for this patient.   Electa Sniff, PT, DPT, NCS 12/02/2020, 8:41 AM  [facility] [address] [place], [place], [address] Phone: [phone]   Fax:  [phone]  Name: [name] MRN: [id number] Date of Birth: 1940/11/14

## 2020-12-09 ENCOUNTER — Other Ambulatory Visit: Payer: Self-pay

## 2020-12-09 ENCOUNTER — Ambulatory Visit: Payer: Medicare Other

## 2020-12-09 DIAGNOSIS — M6281 Muscle weakness (generalized): Secondary | ICD-10-CM

## 2020-12-09 DIAGNOSIS — R262 Difficulty in walking, not elsewhere classified: Secondary | ICD-10-CM

## 2020-12-09 DIAGNOSIS — R293 Abnormal posture: Secondary | ICD-10-CM

## 2020-12-09 DIAGNOSIS — R42 Dizziness and giddiness: Secondary | ICD-10-CM | POA: Diagnosis not present

## 2020-12-09 NOTE — Therapy (Signed)
[facility] [address] [place], [place], [address] Phone: [phone]   Fax:  [phone]  Physical Therapy Treatment  Patient Details  Name: DEJON JUNGMAN MRN: [id number] Date of Birth: [birth year] Referring Provider (PT): Narda Amber   Encounter Date: 12/09/2020   PT End of Session - 12/09/20 0850    Visit Number 2    Number of Visits 6    Date for PT Re-Evaluation 47/42/59   60 day cert, 30 day poc   Authorization Type medicare so 10th visit progress note    PT Start Time 0848    PT Stop Time 0934    PT Time Calculation (min) 46 min    Activity Tolerance Patient tolerated treatment well    Behavior During Therapy [facility] for tasks assessed/performed           Past Medical History:  Diagnosis Date  . Diabetes mellitus without complication ([place])   . Hypertension   . Sarcoidosis     Past Surgical History:  Procedure Laterality Date  . ABDOMINAL HYSTERECTOMY    . ROTATOR CUFF REPAIR    . TRIGGER FINGER RELEASE      There were no vitals filed for this visit.   Subjective Assessment - 12/09/20 0850    Subjective Pt reports that still has the pain/stiffness in thighs when first gets up. The worst was 7-8/10 but resides quickly when gets moving. As far as the dizziness she was doing okay but yesterday she used 1 eye drop and she felt a little swimmy feeling later on when she layed back. Only tried the eye drop again as eye was sore. When does not use the eye drops she does not notice the dizziness when lying back.    Pertinent History muscle spasm, cervical stenosis of spinal canal, neuropathy, hyperCKemia    Patient Stated Goals Pt would like to decrease the stiffness in her legs to improve her function.    Currently in Pain? No/denies   first thing this morning 7/10 in left thigh   Pain Onset More than a month ago                             [facility] Adult PT Treatment/Exercise - 12/09/20  0927      Exercises   Exercises Other Exercises    Other Exercises  Supine: passive stretching to bilateral hips- single knee to chest 30 sec x 3, hip IR/ER 30 sec x 3 each, piriformis 30 sec x 3. Instructed pt on how to perform self knee to chest with pt able to return demonstrate. Also instructed on self piriformis stretch with 1 knee bent and placing other ankle on thigh. Pt feels stretch just getting to position on right and on left instructed to push down on knee some until feels stretch or pull up across body more. Pt more pain limited on right with more stiffness as well. She would feel the pain in right thigh and right glut area near piriformis. Attempted modified thomas test for hip flexor/rectus stretch on right off edge of table but pt reported increased pain all the way down front of leg.      Manual Therapy   Manual Therapy Joint mobilization    Manual therapy comments Prone: passive knee flexion to comfortable stretch point 30 sec x 2 then adding in P/A hip mobs grade 3 4 bouts of 20 sec with gradual increase in knee  flexion. No pain on left but pt did have some pain in anterior thigh with adding more knee flexion on right so backed off the flexion. Assessed gait after session with no pain reported and some improvement in trunk rotation.                  PT Education - 12/09/20 1017    Education Details Started initial stretching HEP. Also discussed spending some time in prone and trying to keep her hips down to feel if she can get some stretch there with possibly adding some bending to knee. Also discussed massage to piriformis area with tennis ball.    Person(s) Educated Patient    Methods Explanation;Demonstration;Handout    Comprehension Verbalized understanding;Returned demonstration            PT Short Term Goals - 12/02/20 0833      PT SHORT TERM GOAL #1   Title STGs=LTGs             PT Long Term Goals - 12/02/20 4010      PT LONG TERM GOAL #1   Title  Pt will be instructed in HEP for flexibility and ROM to better manage her tightness and improve her function.    Time 4    Period Weeks    Status New    Target Date 01/01/21      PT LONG TERM GOAL #2   Title Pt will report <6/10 pain/tightness in legs with activities for improved function.    Time 4    Period Weeks    Status New    Target Date 01/01/21      PT LONG TERM GOAL #3   Title Pt will increase 6 min walk to >1150' for improved community mobility.    Baseline 12/01/20 1000'    Time 4    Period Weeks    Status New    Target Date 01/01/21      PT LONG TERM GOAL #4   Title Vestibular assessment will be performed as warranted and goal written if needed.    Time 4    Period Weeks    Status New    Target Date 01/01/21                 Plan - 12/09/20 1018    Clinical Impression Statement PT focused on establishing intial stretching HEP as well as passive stretching and manual techniques to hips. Pt tight bilaterally but more so on right with more discomfort on right.    Personal Factors and Comorbidities Comorbidity 3+    Comorbidities muscle spasm, cervical stenosis of spinal canal, neuropathy, hyperCKemia    Examination-Activity Limitations Locomotion Level    Examination-Participation Restrictions Community Activity;Volunteer    Stability/Clinical Decision Making Stable/Uncomplicated    Rehab Potential Good    PT Frequency 2x / week    PT Duration Other (comment)   x 1 week followed by 1x/week for 3 weeks   PT Treatment/Interventions Cryotherapy;Moist Heat;Therapeutic exercise;Neuromuscular re-education;Therapeutic activities;Functional mobility training;Gait training;Patient/family education;Manual techniques;Passive range of motion;Vestibular;Canalith Repostioning    PT Next Visit Plan How is initial HEP going? Continue  hip flexibility and ROM especially hip IR/ER, hip flexor. Can we figure out a way to better stretch rectus femoris? Further vestibular assessment  if needed.    Consulted and Agree with Plan of Care Patient           Patient will benefit from skilled therapeutic intervention in order to improve the following  deficits and impairments:  Impaired flexibility,Hypermobility,Pain,Dizziness,Increased muscle spasms,Abnormal gait  Visit Diagnosis: Muscle weakness (generalized)  Abnormal posture  Difficulty in walking, not elsewhere classified     Problem List There are no problems to display for this patient.   Electa Sniff, PT, DPT, NCS 12/09/2020, 10:22 AM  [facility] [address] [place], [place], [address] Phone: [phone]   Fax:  [phone]  Name: AYDA TANCREDI MRN: [id number] Date of Birth: 05/22/1941

## 2020-12-09 NOTE — Patient Instructions (Signed)
Access Code: [email or web address] URL: [email or web address] Date: 12/09/2020 Prepared by: Cherly Anderson  Exercises Supine Single Knee to Chest Stretch - 3 x daily - 7 x weekly - 1 sets - 3 reps - 30 sec hold Supine Figure 4 Piriformis Stretch - 3 x daily - 7 x weekly - 1 sets - 3 reps - 30 sec hold

## 2020-12-11 ENCOUNTER — Ambulatory Visit: Payer: Medicare Other

## 2020-12-11 ENCOUNTER — Other Ambulatory Visit: Payer: Self-pay

## 2020-12-11 DIAGNOSIS — M6281 Muscle weakness (generalized): Secondary | ICD-10-CM | POA: Diagnosis not present

## 2020-12-11 DIAGNOSIS — R262 Difficulty in walking, not elsewhere classified: Secondary | ICD-10-CM | POA: Diagnosis not present

## 2020-12-11 DIAGNOSIS — R42 Dizziness and giddiness: Secondary | ICD-10-CM | POA: Diagnosis not present

## 2020-12-11 DIAGNOSIS — R293 Abnormal posture: Secondary | ICD-10-CM

## 2020-12-11 NOTE — Therapy (Signed)
[facility] [address] [place], [place], [address] Phone: [phone]   Fax:  [phone]  Physical Therapy Treatment  Patient Details  Name: [name] MRN: [id number] Date of Birth: Jun 03, 1941 Referring Provider (PT): Narda Amber   Encounter Date: 12/11/2020   PT End of Session - 12/11/20 0802    Visit Number 3    Number of Visits 6    Date for PT Re-Evaluation 03/47/42   60 day cert, 30 day poc   Authorization Type medicare so 10th visit progress note    PT Start Time 0800    PT Stop Time 0845    PT Time Calculation (min) 45 min    Activity Tolerance Patient tolerated treatment well    Behavior During Therapy [facility] for tasks assessed/performed           Past Medical History:  Diagnosis Date  . Diabetes mellitus without complication ([place])   . Hypertension   . Sarcoidosis     Past Surgical History:  Procedure Laterality Date  . ABDOMINAL HYSTERECTOMY    . ROTATOR CUFF REPAIR    . TRIGGER FINGER RELEASE      There were no vitals filed for this visit.   Subjective Assessment - 12/11/20 0802    Subjective Pt reports that she has some soreness. Mostly in right piriformis area. Did feel that tightness was some better when got up this morning.    Pertinent History muscle spasm, cervical stenosis of spinal canal, neuropathy, hyperCKemia    Patient Stated Goals Pt would like to decrease the stiffness in her legs to improve her function.    Currently in Pain? Yes    Pain Location Buttocks    Pain Orientation Right    Pain Descriptors / Indicators Sore    Pain Type Chronic pain    Pain Onset More than a month ago    Pain Frequency Intermittent    Aggravating Factors  stooping down                             [facility] Adult PT Treatment/Exercise - 12/11/20 0805      Ambulation/Gait   Ambulation/Gait Yes    Ambulation/Gait Assistance 7: Independent    Ambulation Distance (Feet) 115  Feet   x 2   Assistive device None    Gait Pattern Step-through pattern    Ambulation Surface Level;Indoor    Gait Comments 1st bout pt was observed after supine stretching. Pt was noted to have anterior pelvic tilt with some decreased hip extension and trunk rotation. 2nd bout after manual techniques and pt noted to have slight increase in step length and hip extension.      Exercises   Exercises Other Exercises    Other Exercises  Supine: passive stretching to bilateral hips- passive knee to chest 30 sec x 3, piriformis stretch30 sec x 3, hooklying ER/IR 30 sec x 3 each. Pt unable to tolerate as much on the right as does get some pain in right piriformis area. Prone rectus femoris stretch 30 sec x 3 within tolerable range. PT only able to bend knee to about 30-40 degrees. Supine bridges x 10 with verbal cues to posterior pelvic tilt first and engage abdominals first. Pt reported some stretching in front of hips but denied any pain.      Manual Therapy   Manual Therapy Joint mobilization;Soft tissue mobilization    Manual therapy comments  Supine: right hip distraction through ankle 20 sec x 1 with pt reporting some discomfort in her knee. PT performed inferior hip glide with hip/knee flexed using mobilization belt 20 sec x 3. Pt denied any pain with this. Prone: PA hip mobs 20 sec x 3 each side and then adding in some knee flexion for 1 bout of 20 sec and then 1 bout of 20 sec with some added hip extension.    Soft tissue mobilization STM to right piriformis area x 2 min with trigger point release 20 sec x 3. Pt reported being less tender to palpation after but still having some pain in that area.                  PT Education - 12/11/20 1753    Education Details Added bridge to HEP    Person(s) Educated Patient    Methods Explanation;Demonstration;Handout    Comprehension Verbalized understanding            PT Short Term Goals - 12/02/20 0833      PT SHORT TERM GOAL #1   Title  STGs=LTGs             PT Long Term Goals - 12/02/20 6269      PT LONG TERM GOAL #1   Title Pt will be instructed in HEP for flexibility and ROM to better manage her tightness and improve her function.    Time 4    Period Weeks    Status New    Target Date 01/01/21      PT LONG TERM GOAL #2   Title Pt will report <6/10 pain/tightness in legs with activities for improved function.    Time 4    Period Weeks    Status New    Target Date 01/01/21      PT LONG TERM GOAL #3   Title Pt will increase 6 min walk to >1150' for improved community mobility.    Baseline 12/01/20 1000'    Time 4    Period Weeks    Status New    Target Date 01/01/21      PT LONG TERM GOAL #4   Title Vestibular assessment will be performed as warranted and goal written if needed.    Time 4    Period Weeks    Status New    Target Date 01/01/21                 Plan - 12/11/20 1756    Clinical Impression Statement PT continued to focus on stretching hips and manual techniques to improved mobility and decrease pain. Pt reported feeling a little looser after and did not have the pain in right anterior thigh today with most pain isolated to right piriformis area.    Personal Factors and Comorbidities Comorbidity 3+    Comorbidities muscle spasm, cervical stenosis of spinal canal, neuropathy, hyperCKemia    Examination-Activity Limitations Locomotion Level    Examination-Participation Restrictions Community Activity;Volunteer    Stability/Clinical Decision Making Stable/Uncomplicated    Rehab Potential Good    PT Frequency 2x / week    PT Duration Other (comment)   x 1 week followed by 1x/week for 3 weeks   PT Treatment/Interventions Cryotherapy;Moist Heat;Therapeutic exercise;Neuromuscular re-education;Therapeutic activities;Functional mobility training;Gait training;Patient/family education;Manual techniques;Passive range of motion;Vestibular;Canalith Repostioning    PT Next Visit Plan How is HEP  going? Continue  hip flexibility and ROM especially hip IR/ER, hip flexor. Can we figure out a way to  better stretch rectus femoris? Further vestibular assessment if needed.    Consulted and Agree with Plan of Care Patient           Patient will benefit from skilled therapeutic intervention in order to improve the following deficits and impairments:  Impaired flexibility,Hypermobility,Pain,Dizziness,Increased muscle spasms,Abnormal gait  Visit Diagnosis: Muscle weakness (generalized)  Abnormal posture     Problem List There are no problems to display for this patient.   Electa Sniff, PT, DPT, NCS 12/11/2020, 6:00 PM  [facility] [address] [place], [place], [address] Phone: [phone]   Fax:  [phone]  Name: [name] MRN: [id number] Date of Birth: 1941/02/01

## 2020-12-11 NOTE — Patient Instructions (Signed)
Access Code: [email or web address] URL: [email or web address] Date: 12/11/2020 Prepared by: Cherly Anderson  Exercises Supine Single Knee to Chest Stretch - 3 x daily - 7 x weekly - 1 sets - 3 reps - 30 sec hold Supine Figure 4 Piriformis Stretch - 3 x daily - 7 x weekly - 1 sets - 3 reps - 30 sec hold Supine Bridge - 1 x daily - 5 x weekly - 1 sets - 10 reps

## 2020-12-16 ENCOUNTER — Ambulatory Visit: Payer: Medicare Other

## 2020-12-16 ENCOUNTER — Other Ambulatory Visit: Payer: Self-pay

## 2020-12-16 DIAGNOSIS — R262 Difficulty in walking, not elsewhere classified: Secondary | ICD-10-CM | POA: Diagnosis not present

## 2020-12-16 DIAGNOSIS — R293 Abnormal posture: Secondary | ICD-10-CM | POA: Diagnosis not present

## 2020-12-16 DIAGNOSIS — M6281 Muscle weakness (generalized): Secondary | ICD-10-CM | POA: Diagnosis not present

## 2020-12-16 DIAGNOSIS — R42 Dizziness and giddiness: Secondary | ICD-10-CM | POA: Diagnosis not present

## 2020-12-16 NOTE — Patient Instructions (Signed)
Access Code: [email or web address] URL: [email or web address] Date: 12/16/2020 Prepared by: Cherly Anderson  Exercises Supine Single Knee to Chest Stretch - 3 x daily - 7 x weekly - 1 sets - 3 reps - 30 sec hold Supine Figure 4 Piriformis Stretch - 3 x daily - 7 x weekly - 1 sets - 3 reps - 30 sec hold Supine Bridge - 1 x daily - 5 x weekly - 1 sets - 10 reps Seated Posterior Pelvic Tilt - 1 x daily - 7 x weekly - 1 sets - 10 reps

## 2020-12-16 NOTE — Therapy (Signed)
[facility] [address] [place], [place], [address] Phone: [phone]   Fax:  [phone]  Physical Therapy Treatment  Patient Details  Name: [name] MRN: [id number] Date of Birth: 31-Mar-1941 Referring Provider (PT): Narda Amber   Encounter Date: 12/16/2020   PT End of Session - 12/16/20 0847    Visit Number 4    Number of Visits 6    Date for PT Re-Evaluation 43/56/86   60 day cert, 30 day poc   Authorization Type medicare so 10th visit progress note    PT Start Time 0845    PT Stop Time 0933    PT Time Calculation (min) 48 min    Activity Tolerance Patient tolerated treatment well    Behavior During Therapy [facility] for tasks assessed/performed           Past Medical History:  Diagnosis Date  . Diabetes mellitus without complication ([place])   . Hypertension   . Sarcoidosis     Past Surgical History:  Procedure Laterality Date  . ABDOMINAL HYSTERECTOMY    . ROTATOR CUFF REPAIR    . TRIGGER FINGER RELEASE      There were no vitals filed for this visit.   Subjective Assessment - 12/16/20 0848    Subjective Pt reports that yesterday morning she was having more pain/tightness but had also been out in the yard doing a lot of bending. She reports that she is mostly having the soreness in piriformis area and anterior thighs.    Pertinent History muscle spasm, cervical stenosis of spinal canal, neuropathy, hyperCKemia    Patient Stated Goals Pt would like to decrease the stiffness in her legs to improve her function.    Currently in Pain? Yes    Pain Score 7     Pain Location Buttocks    Pain Orientation Left;Right    Pain Descriptors / Indicators Sore    Pain Type Chronic pain    Pain Onset More than a month ago    Pain Frequency Intermittent    Aggravating Factors  stooping down, when first gets up                             [facility] Adult PT Treatment/Exercise - 12/16/20 0851       Ambulation/Gait   Ambulation/Gait Yes    Ambulation/Gait Assistance 7: Independent      Therapeutic Activites    Therapeutic Activities Other Therapeutic Activities    Other Therapeutic Activities Pt educated on proper technique when stooping down to pick up items. Discussed bending at knees to squat down and not bending at back. Pt able to return demonstrate to PT      Exercises   Exercises Other Exercises;Knee/Hip    Other Exercises  Supine: bridges x 10 with verbal cues to engage core first to keep back flat. Pt reported feeling some in thighs more muscles working. Reassessed right hamstring length and was WNL >80 degrees with no pain. Pt did have some pain in right groin and piriformis area with right knee to chest. PT performed passive right piriformis stretch 20 sec x 3 within tolerable range. Pt able to relax in to stretch a little more each time. At end of session: seated posterior pelvic tilt seated first attempted on mat then added dynadisc to try to get more movement. Pt needed max verbal and tactile cues to perform correctly as struggled to not just  lean back reporting stretching in bottom when did tilt back. Stated the soft surface actually felt good on her bottom with less pressure. Demonstrated and had pt trial seated tennis ball self massage to right piriformis/quadratus area. Pt reported she could feel more when placed ball on back of seat to get a little higher up.      Knee/Hip Exercises: Aerobic   Other Aerobic Sci Fit x 5 min level 5.6. Pt denied any pain after.      Manual Therapy   Manual Therapy Soft tissue mobilization    Manual therapy comments prone STM to right piriformis/quadratus lumborum area x 10 min the trigger point release incorporated in for 20 sec at a time throughout. Pt very tender to palpation with multiple trigger points feeling much tighter than left side. She reported feeling a little looser after once stood up but does feel that it tightens back up  quickly. PT discussed option to trial dry needling to see if can get a longer period of release to try to relax muscle more. In prone passive right knee flexion assessed again and pt able to get to about 60 degrees passively and about 70 actively as seems to be able to relax more when performs on own. She was feeling stretching in front of thigh and some discomfort in right pirifomis area. PT performed grade 2/3 PA mobs to S1 2 bouts of 20 sec and L4/5 right unilateral and left unilateral with no reports of pain with these mobs.                  PT Education - 12/16/20 0944    Education Details Added seated posterior pelvic tilt to HEP and discussed using tennis ball for self massage to right piriformis/quadratus area.    Person(s) Educated Patient    Methods Explanation;Demonstration;Handout    Comprehension Verbalized understanding            PT Short Term Goals - 12/02/20 0833      PT SHORT TERM GOAL #1   Title STGs=LTGs             PT Long Term Goals - 12/02/20 2025      PT LONG TERM GOAL #1   Title Pt will be instructed in HEP for flexibility and ROM to better manage her tightness and improve her function.    Time 4    Period Weeks    Status New    Target Date 01/01/21      PT LONG TERM GOAL #2   Title Pt will report <6/10 pain/tightness in legs with activities for improved function.    Time 4    Period Weeks    Status New    Target Date 01/01/21      PT LONG TERM GOAL #3   Title Pt will increase 6 min walk to >1150' for improved community mobility.    Baseline 12/01/20 1000'    Time 4    Period Weeks    Status New    Target Date 01/01/21      PT LONG TERM GOAL #4   Title Vestibular assessment will be performed as warranted and goal written if needed.    Time 4    Period Weeks    Status New    Target Date 01/01/21                 Plan - 12/16/20 0945    Clinical Impression Statement Pt continues to have more  pain in right piriformis area.  Able to relax area some with manual techinques but not lasting long. Once pt gets up moving soreness/tightness resolves quickly. She had difficulty with performing posterior pelvic tilt today as sits in anterior tilt. Pain was down to 5/10 at end of session.    Personal Factors and Comorbidities Comorbidity 3+    Comorbidities muscle spasm, cervical stenosis of spinal canal, neuropathy, hyperCKemia    Examination-Activity Limitations Locomotion Level    Examination-Participation Restrictions Community Activity;Volunteer    Stability/Clinical Decision Making Stable/Uncomplicated    Rehab Potential Good    PT Frequency 2x / week    PT Duration Other (comment)   x 1 week followed by 1x/week for 3 weeks   PT Treatment/Interventions Cryotherapy;Moist Heat;Therapeutic exercise;Neuromuscular re-education;Therapeutic activities;Functional mobility training;Gait training;Patient/family education;Manual techniques;Passive range of motion;Vestibular;Canalith Repostioning;Dry needling    PT Next Visit Plan PT sending updated cert to MD to add dry needling to poc to see if we can trial this for tight right piriformis. quadratus. Continue  hip flexibility and ROM especially hip IR/ER, hip flexor. Rectus femoris length is limited. Right piriformis/quadratus very tight. Further vestibular assessment if needed.    Consulted and Agree with Plan of Care Patient           Patient will benefit from skilled therapeutic intervention in order to improve the following deficits and impairments:  Impaired flexibility,Hypermobility,Pain,Dizziness,Increased muscle spasms,Abnormal gait  Visit Diagnosis: Abnormal posture  Difficulty in walking, not elsewhere classified  Muscle weakness (generalized)     Problem List There are no problems to display for this patient.   Electa Sniff, PT, DPT, NCS 12/16/2020, 9:57 AM  [facility] [address] [place], [place], [address] Phone: [phone]   Fax:  [phone]  Name: BAELYN DORING MRN: [id number] Date of Birth: [birth year]

## 2020-12-21 DIAGNOSIS — H35361 Drusen (degenerative) of macula, right eye: Secondary | ICD-10-CM | POA: Diagnosis not present

## 2020-12-21 DIAGNOSIS — H20042 Secondary noninfectious iridocyclitis, left eye: Secondary | ICD-10-CM | POA: Diagnosis not present

## 2020-12-21 DIAGNOSIS — H43813 Vitreous degeneration, bilateral: Secondary | ICD-10-CM | POA: Diagnosis not present

## 2020-12-21 DIAGNOSIS — H59032 Cystoid macular edema following cataract surgery, left eye: Secondary | ICD-10-CM | POA: Diagnosis not present

## 2020-12-23 ENCOUNTER — Ambulatory Visit: Payer: Medicare Other

## 2020-12-24 ENCOUNTER — Ambulatory Visit: Payer: Medicare Other | Admitting: Physical Therapy

## 2020-12-24 ENCOUNTER — Other Ambulatory Visit: Payer: Self-pay

## 2020-12-24 DIAGNOSIS — R42 Dizziness and giddiness: Secondary | ICD-10-CM | POA: Diagnosis not present

## 2020-12-24 DIAGNOSIS — R262 Difficulty in walking, not elsewhere classified: Secondary | ICD-10-CM

## 2020-12-24 DIAGNOSIS — R293 Abnormal posture: Secondary | ICD-10-CM | POA: Diagnosis not present

## 2020-12-24 DIAGNOSIS — M6281 Muscle weakness (generalized): Secondary | ICD-10-CM | POA: Diagnosis not present

## 2020-12-24 NOTE — Therapy (Signed)
[facility] [address] [place], [place], [address] Phone: [phone]   Fax:  [phone]  Physical Therapy Treatment  Patient Details  Name: [name] MRN: [id number] Date of Birth: Oct 02, 1940 Referring Provider (PT): Narda Amber   Encounter Date: 12/24/2020   PT End of Session - 12/24/20 0805    Visit Number 5    Number of Visits 6    Date for PT Re-Evaluation 83/15/17   60 day cert, 30 day poc   Authorization Type medicare so 10th visit progress note    PT Start Time 0803    PT Stop Time 0845    PT Time Calculation (min) 42 min    Activity Tolerance Patient tolerated treatment well    Behavior During Therapy [facility] for tasks assessed/performed           Past Medical History:  Diagnosis Date  . Diabetes mellitus without complication ([place])   . Hypertension   . Sarcoidosis     Past Surgical History:  Procedure Laterality Date  . ABDOMINAL HYSTERECTOMY    . ROTATOR CUFF REPAIR    . TRIGGER FINGER RELEASE      There were no vitals filed for this visit.   Subjective Assessment - 12/24/20 0806    Subjective Pt still having good days and bad days.  Worked at [employer] and stooping is what bothers her the most.  Feels pretty good today.  Hot showers help and she is doing her exercises.    Pertinent History muscle spasm, cervical stenosis of spinal canal, neuropathy, hyperCKemia    Patient Stated Goals Pt would like to decrease the stiffness in her legs to improve her function.    Currently in Pain? Yes    Pain Score 4     Pain Location Buttocks    Pain Orientation Right;Left    Pain Descriptors / Indicators Sore    Pain Onset More than a month ago                             [facility] Adult PT Treatment/Exercise - 12/24/20 1130      Knee/Hip Exercises: Prone   Other Prone Exercises Quadruped: performed 5 reps cat-cow to continue to focus on pelvic tilts with use of breathing;  added to pelvic tilts rocking hips back to heels in midline x 5 reps and then to R and L side x 2 reps to each side to increase quad flexibility and lateral hip/buttocks flexibility with rotation.      Manual Therapy   Manual Therapy Soft tissue mobilization    Soft tissue mobilization supine STM to R quadriceps muscles along full length of muscle after dry needling to improve circulation and mobility of muscle/myofascial tissue            Trigger Point Dry Needling - 12/24/20 1130    Consent Given? Yes    Education Handout Provided Yes    Muscles Treated Lower Quadrant Quadriceps    Dry Needling Comments performed on R side only    Quadriceps Response Twitch response elicited;Palpable increased muscle length          Reviewed the following information with patient prior to performing dry needling.  Also screened pt for any precautions or contraindications: none found.  Trigger Point Dry Needling  . What is Trigger Point Dry Needling (DN)? o DN is a physical therapy technique used to treat muscle pain  and dysfunction. Specifically, DN helps deactivate muscle trigger points (muscle knots).  o A thin filiform needle is used to penetrate the skin and stimulate the underlying trigger point. The goal is for a local twitch response (LTR) to occur and for the trigger point to relax. No medication of any kind is injected during the procedure.   . What Does Trigger Point Dry Needling Feel Like?  o The procedure feels different for each individual patient. Some patients report that they do not actually feel the needle enter the skin and overall the process is not painful. Very mild bleeding may occur. However, many patients feel a deep cramping in the muscle in which the needle was inserted. This is the local twitch response.   Marland Kitchen How Will I feel after the treatment? o Soreness is normal, and the onset of soreness may not occur for a few hours. Typically this soreness does not last longer than  two days.  o Bruising is uncommon, however; ice can be used to decrease any possible bruising.  o In rare cases feeling tired or nauseous after the treatment is normal. In addition, your symptoms may get worse before they get better, this period will typically not last longer than 24 hours.   . What Can I do After My Treatment? o Increase your hydration by drinking more water for the next 24 hours. o You may place ice or heat on the areas treated that have become sore, however, do not use heat on inflamed or bruised areas. Heat often brings more relief post needling. o You can continue your regular activities, but vigorous activity is not recommended initially after the treatment for 24 hours. o DN is best combined with other physical therapy such as strengthening, stretching, and other therapies      PT Education - 12/24/20 1129    Education Details educated on dry needling    Person(s) Educated Patient    Methods Explanation;Handout    Comprehension Verbalized understanding            PT Short Term Goals - 12/02/20 0833      PT SHORT TERM GOAL #1   Title STGs=LTGs             PT Long Term Goals - 12/02/20 0973      PT LONG TERM GOAL #1   Title Pt will be instructed in HEP for flexibility and ROM to better manage her tightness and improve her function.    Time 4    Period Weeks    Status New    Target Date 01/01/21      PT LONG TERM GOAL #2   Title Pt will report <6/10 pain/tightness in legs with activities for improved function.    Time 4    Period Weeks    Status New    Target Date 01/01/21      PT LONG TERM GOAL #3   Title Pt will increase 6 min walk to >1150' for improved community mobility.    Baseline 12/01/20 1000'    Time 4    Period Weeks    Status New    Target Date 01/01/21      PT LONG TERM GOAL #4   Title Vestibular assessment will be performed as warranted and goal written if needed.    Time 4    Period Weeks    Status New    Target Date  01/01/21  Plan - 12/24/20 1126    Clinical Impression Statement Pt educated on purpose and use of trigger point dry needling to address painful myofascial trigger points and to improve ROM.  Pt agreeable.  Focused dry needling today on R quadriceps muscles followed by STM and ROM in WB/closed chain positions.  Pt reporting soreness in R thigh but improvement in overall pain in RLE at end of session.  If beneficial, will use to address piriformis.    Personal Factors and Comorbidities Comorbidity 3+    Comorbidities muscle spasm, cervical stenosis of spinal canal, neuropathy, hyperCKemia    Examination-Activity Limitations Locomotion Level    Examination-Participation Restrictions Community Activity;Volunteer    Stability/Clinical Decision Making Stable/Uncomplicated    Rehab Potential Good    PT Frequency 2x / week    PT Duration Other (comment)   x 1 week followed by 1x/week for 3 weeks   PT Treatment/Interventions Cryotherapy;Moist Heat;Therapeutic exercise;Neuromuscular re-education;Therapeutic activities;Functional mobility training;Gait training;Patient/family education;Manual techniques;Passive range of motion;Vestibular;Canalith Repostioning;Dry needling    PT Next Visit Plan How did she tolerate dry needling?? need another session to address piriformis or other muscles?  Continue hip flexibility and ROM especially hip IR/ER, hip flexor. Rectus femoris length is limited. Right piriformis/quadratus very tight. Further vestibular assessment if needed.    Consulted and Agree with Plan of Care Patient           Patient will benefit from skilled therapeutic intervention in order to improve the following deficits and impairments:  Impaired flexibility,Hypermobility,Pain,Dizziness,Increased muscle spasms,Abnormal gait  Visit Diagnosis: Difficulty in walking, not elsewhere classified  Muscle weakness (generalized)     Problem List There are no problems to display  for this patient.   Rico Junker, PT, DPT 12/24/20    11:34 AM    [facility] [address] [place], [place], [address] Phone: [phone]   Fax:  [phone]  Name: [name] MRN: [id number] Date of Birth: 06-Jan-1941

## 2020-12-24 NOTE — Patient Instructions (Signed)

## 2020-12-30 ENCOUNTER — Ambulatory Visit: Payer: Medicare Other

## 2021-01-06 ENCOUNTER — Ambulatory Visit: Payer: Medicare Other | Attending: Neurology

## 2021-01-06 ENCOUNTER — Other Ambulatory Visit: Payer: Self-pay

## 2021-01-06 DIAGNOSIS — M6281 Muscle weakness (generalized): Secondary | ICD-10-CM | POA: Insufficient documentation

## 2021-01-06 DIAGNOSIS — R42 Dizziness and giddiness: Secondary | ICD-10-CM

## 2021-01-06 DIAGNOSIS — R262 Difficulty in walking, not elsewhere classified: Secondary | ICD-10-CM | POA: Insufficient documentation

## 2021-01-06 DIAGNOSIS — R293 Abnormal posture: Secondary | ICD-10-CM

## 2021-01-06 NOTE — Therapy (Addendum)
[facility] [address] [place], [place], [address] Phone: [phone]   Fax:  [phone]  Physical Therapy Therapy Re-certification Note  Patient Details  Name: [name] MRN: [id number] Date of Birth: 05-22-1941 Referring Provider (PT): Narda Amber   Encounter Date: 01/06/2021   PT End of Session - 01/06/21 0849     Visit Number 6    Number of Visits 6    Date for PT Re-Evaluation 16/01/09   60 day cert, 30 day poc   Authorization Type medicare so 10th visit progress note; Recert on 09/30/33 for 1x/week for 8 weeks until 03/03/21    Activity Tolerance Patient tolerated treatment well    Behavior During Therapy [facility] for tasks assessed/performed             Past Medical History:  Diagnosis Date   Diabetes mellitus without complication ([place])    Hypertension    Sarcoidosis     Past Surgical History:  Procedure Laterality Date   ABDOMINAL HYSTERECTOMY     ROTATOR CUFF REPAIR     TRIGGER FINGER RELEASE      There were no vitals filed for this visit.   Subjective Assessment - 01/06/21 0853     Subjective AFter dry needling she was sore next day but has found improvement in her thigh symptoms. She has not felt pain for last couple of days. She is feeling discomfort in her upper back as she has issues with her neck and R shoulder. Pt reports her dizziness is better as it was mostly related to medication and she changed the way she is taking medicine.    Pertinent History muscle spasm, cervical stenosis of spinal canal, neuropathy, hyperCKemia    Patient Stated Goals Pt would like to decrease the stiffness in her legs to improve her function.    Currently in Pain? No/denies    Pain Onset More than a month ago                  Manual therapy: Passively stretched bil hips into hip flexion: pt feeling posterior thigh pain in R hip but no L hip ith more tissue restrictions felt in R posterior hip  compared to left L sidelying: R hip flexor stretch: passive Had patient try this stretch in supine off edge of bed with dropping  leg off edge of bed.: 5 x 30" R and L L sidelying R hip extension stretch: 5 x 30" Seated hamstring curls: green band: 2 x 10 R and L                        PT Short Term Goals - 12/02/20 0833       PT SHORT TERM GOAL #1   Title STGs=LTGs               PT Long Term Goals - 12/02/20 5732       PT LONG TERM GOAL #1   Title Pt will be instructed in HEP for flexibility and ROM to better manage her tightness and improve her function.    Time 4    Period Weeks    Status New    Target Date 01/01/21      PT LONG TERM GOAL #2   Title Pt will report <6/10 pain/tightness in legs with activities for improved function.    Time 4    Period Weeks    Status New    Target  Date 01/01/21      PT LONG TERM GOAL #3   Title Pt will increase 6 min walk to >1150' for improved community mobility.    Baseline 12/01/20 1000'    Time 4    Period Weeks    Status New    Target Date 01/01/21      PT LONG TERM GOAL #4   Title Vestibular assessment will be performed as warranted and goal written if needed.    Time 4    Period Weeks    Status New    Target Date 01/01/21                01/06/21 0912  Plan  Clinical Impression Statement Pt has been seen for total of 6 sessions for leg pain and gait difficulties. patient has signficaint ROM restrictions in bil hips R>L and muscle tightness in anteriro and posterior hip musuclature which is causing her pain and gait difficulties. patient is responding well with dry needling as her pain and ROM is improving. patient will continue to benefit from skilled PT to improve bil hip ROM, strength, and gait.  Personal Factors and Comorbidities Comorbidity 3+  Comorbidities muscle spasm, cervical stenosis of spinal canal, neuropathy, hyperCKemia  Examination-Activity Limitations Locomotion Level   Examination-Participation Restrictions Community Activity;Volunteer  Pt will benefit from skilled therapeutic intervention in order to improve on the following deficits Impaired flexibility;Hypermobility;Pain;Dizziness;Increased muscle spasms;Abnormal gait  Stability/Clinical Decision Making Stable/Uncomplicated  Rehab Potential Good  PT Frequency 1x / week  PT Duration 8 weeks (x 1 week followed by 1x/week for 3 weeks)  PT Treatment/Interventions Cryotherapy;Moist Heat;Therapeutic exercise;Neuromuscular re-education;Therapeutic activities;Functional mobility training;Gait training;Patient/family education;Manual techniques;Passive range of motion;Vestibular;Canalith Repostioning;Dry needling;Balance training;Energy conservation;Spinal Manipulations;Joint Manipulations  PT Next Visit Plan How did she tolerate dry needling?? need another session to address piriformis or other muscles?  Continue hip flexibility and ROM especially hip IR/ER, hip flexor. Rectus femoris length is limited. Right piriformis/quadratus very tight. Further vestibular assessment if needed.  PT Home Exercise Plan [email or web address]  Consulted and Agree with Plan of Care Patient        Patient will benefit from skilled therapeutic intervention in order to improve the following deficits and impairments:     Visit Diagnosis: Muscle weakness (generalized)  Difficulty in walking, not elsewhere classified  Abnormal posture  Dizziness and giddiness     Problem List There are no problems to display for this patient.   Kerrie Pleasure, PT 01/06/2021, 9:06 AM  [facility] [address] [place], [place], [address] Phone: [phone]   Fax:  [phone]  Name: CHONDA BANEY MRN: [id number] Date of Birth: [birth year]

## 2021-01-11 DIAGNOSIS — H43813 Vitreous degeneration, bilateral: Secondary | ICD-10-CM | POA: Diagnosis not present

## 2021-01-11 DIAGNOSIS — H35361 Drusen (degenerative) of macula, right eye: Secondary | ICD-10-CM | POA: Diagnosis not present

## 2021-01-11 DIAGNOSIS — H59032 Cystoid macular edema following cataract surgery, left eye: Secondary | ICD-10-CM | POA: Diagnosis not present

## 2021-01-11 DIAGNOSIS — H20042 Secondary noninfectious iridocyclitis, left eye: Secondary | ICD-10-CM | POA: Diagnosis not present

## 2021-01-12 DIAGNOSIS — Z7984 Long term (current) use of oral hypoglycemic drugs: Secondary | ICD-10-CM | POA: Diagnosis not present

## 2021-01-12 DIAGNOSIS — D860 Sarcoidosis of lung: Secondary | ICD-10-CM | POA: Diagnosis not present

## 2021-01-12 DIAGNOSIS — I10 Essential (primary) hypertension: Secondary | ICD-10-CM | POA: Diagnosis not present

## 2021-01-12 DIAGNOSIS — M8580 Other specified disorders of bone density and structure, unspecified site: Secondary | ICD-10-CM | POA: Diagnosis not present

## 2021-01-12 DIAGNOSIS — E7800 Pure hypercholesterolemia, unspecified: Secondary | ICD-10-CM | POA: Diagnosis not present

## 2021-01-12 DIAGNOSIS — E1169 Type 2 diabetes mellitus with other specified complication: Secondary | ICD-10-CM | POA: Diagnosis not present

## 2021-01-14 ENCOUNTER — Encounter: Payer: Medicare Other | Admitting: Physical Therapy

## 2021-01-15 ENCOUNTER — Other Ambulatory Visit: Payer: Self-pay

## 2021-01-15 ENCOUNTER — Encounter: Payer: Self-pay | Admitting: Physical Therapy

## 2021-01-15 ENCOUNTER — Ambulatory Visit: Payer: Medicare Other | Admitting: Physical Therapy

## 2021-01-15 DIAGNOSIS — R42 Dizziness and giddiness: Secondary | ICD-10-CM | POA: Diagnosis not present

## 2021-01-15 DIAGNOSIS — R262 Difficulty in walking, not elsewhere classified: Secondary | ICD-10-CM

## 2021-01-15 DIAGNOSIS — M6281 Muscle weakness (generalized): Secondary | ICD-10-CM | POA: Diagnosis not present

## 2021-01-15 DIAGNOSIS — R293 Abnormal posture: Secondary | ICD-10-CM | POA: Diagnosis not present

## 2021-01-15 NOTE — Patient Instructions (Addendum)
Access Code: [email or web address] URL: [email or web address] Date: 01/15/2021 Prepared by: Misty Stanley  Program Notes Look into re-joining the [employer] for seated SciFit/NuStep, walking and pool exercises.   Exercises Supine Single Knee to Chest Stretch - 3 x daily - 7 x weekly - 1 sets - 3 reps - 30 sec hold Supine Figure 4 Piriformis Stretch - 3 x daily - 7 x weekly - 1 sets - 3 reps - 30 sec hold Supine Bridge - 1 x daily - 5 x weekly - 1 sets - 10 reps Seated Posterior Pelvic Tilt - 1 x daily - 7 x weekly - 1 sets - 10 reps Standing Bilateral Gastroc Stretch with Step - 1 x daily - 7 x weekly - 2 sets - 30 second hold Standing Hamstring Stretch with Step - 1 x daily - 7 x weekly - 2 sets - 30 seconds hold Hip Flexor Stretch on Step - 1 x daily - 7 x weekly - 2 sets - 30 seconds hold

## 2021-01-15 NOTE — Therapy (Signed)
[facility] [address] [place], [place], [address] Phone: [phone]   Fax:  [phone]  Physical Therapy Treatment  Patient Details  Name: [name] MRN: [id number] Date of Birth: 04-22-1941 Referring Provider (PT): Narda Amber   Encounter Date: 01/15/2021   PT End of Session - 01/15/21 0942     Visit Number 7    Number of Visits 14    Date for PT Re-Evaluation 03/03/21    Authorization Type medicare so 10th visit progress note; Recert on 0/0/37 for 1x/week for 8 weeks until 03/03/21    Progress Note Due on Visit 16    PT Start Time 0718    PT Stop Time 0800    PT Time Calculation (min) 42 min    Activity Tolerance Patient tolerated treatment well    Behavior During Therapy [facility] for tasks assessed/performed             Past Medical History:  Diagnosis Date   Diabetes mellitus without complication ([place])    Hypertension    Sarcoidosis     Past Surgical History:  Procedure Laterality Date   ABDOMINAL HYSTERECTOMY     ROTATOR CUFF REPAIR     TRIGGER FINGER RELEASE      There were no vitals filed for this visit.   Subjective Assessment - 01/15/21 0723     Subjective No issues with dizziness; has been in a lot of pain in bilat quads over the past few days.  Gets tight after sitting for prolonged periods.  Potassium was low on recent bloodwork.    Pertinent History muscle spasm, cervical stenosis of spinal canal, neuropathy, hyperCKemia    Patient Stated Goals Pt would like to decrease the stiffness in her legs to improve her function.    Currently in Pain? Yes    Pain Score 4     Pain Location Leg    Pain Orientation Right;Left;Anterior                               [facility] Adult PT Treatment/Exercise - 01/15/21 0801       Exercises   Exercises Other Exercises;Knee/Hip    Other Exercises  Reviewed other ways to perform stretches using step; performed 2 sets x 30 seconds  R/L gastroc stretch, hip flexor stretch and hamstring stretch. Pt tolerated well      Knee/Hip Exercises: Aerobic   Nustep SCIFit stepper: level 3 resistance x 5 minutes for warm up prior to stretches; discussed options for performing in community.  Pt reports she used to use this machine at the [facility] before the pandemic as well as walking the track      Manual Therapy   Manual Therapy Soft tissue mobilization    Soft tissue mobilization educated pt how to use tactile/myofascial release ball in sitting on bilat quads and how to grade pressure.  Performed multiple minutes each LE.            Access Code: [email or web address] URL: [email or web address] Date: 01/15/2021 Prepared by: Misty Stanley  Program Notes Look into re-joining the [employer] for seated SciFit/NuStep, walking and pool exercises.   Exercises Supine Single Knee to Chest Stretch - 3 x daily - 7 x weekly - 1 sets - 3 reps - 30 sec hold Supine Figure 4 Piriformis Stretch - 3 x daily - 7 x weekly - 1 sets - 3 reps - 30 sec hold Supine Bridge -  1 x daily - 5 x weekly - 1 sets - 10 reps Seated Posterior Pelvic Tilt - 1 x daily - 7 x weekly - 1 sets - 10 reps Standing Bilateral Gastroc Stretch with Step - 1 x daily - 7 x weekly - 2 sets - 30 second hold Standing Hamstring Stretch with Step - 1 x daily - 7 x weekly - 2 sets - 30 seconds hold Hip Flexor Stretch on Step - 1 x daily - 7 x weekly - 2 sets - 30 seconds hold         PT Education - 01/15/21 0804     Education Details updated HEP and added recommendation to return to [facility] for aerobic, walking and possible aquatic.    Person(s) Educated Patient    Methods Explanation;Demonstration;Handout    Comprehension Verbalized understanding;Returned demonstration              PT Short Term Goals - 01/06/21 1006       PT SHORT TERM GOAL #1   Title Pt will demo 0 deg of hip extension in R hip to improve gait and reduce pain    Baseline -25 deg hip extension  (01/06/21)    Time 4    Period Weeks    Status Revised    Target Date 02/03/21               PT Long Term Goals - 01/06/21 1007       PT LONG TERM GOAL #1   Title Pt will be instructed in HEP for flexibility and ROM to better manage her tightness and improve her function.    Time 8    Period Weeks    Status On-going    Target Date 03/03/21      PT LONG TERM GOAL #2   Title Pt will report <6/10 pain/tightness in legs with activities for improved function.    Baseline Not consistent yet (good days and bad days)01/06/21    Time 8    Period Weeks    Status On-going    Target Date 03/03/21      PT LONG TERM GOAL #3   Title Pt will increase 6 min walk to >1150' for improved community mobility.    Baseline 12/01/20 1000'    Time 8    Period Weeks    Status On-going    Target Date 03/03/21      PT LONG TERM GOAL #4   Title Vestibular assessment will be performed as warranted and goal written if needed.    Time 8    Period Weeks    Status On-going    Target Date 03/03/21                   Plan - 01/15/21 0944     Clinical Impression Statement Pt responded well to dry needling two sessions ago but does report return of significant tightness and spasms; does not wish to continue to pursue DN.  Treatment session focused on revision of LE stretches to standing with use of stairs with pt demonstrating improved tolerance.  Also performed assessment of tolerance for aerobic exercise on SciFit and discussed plan for ongoing community wellness.  Recommending pt return to [facility].  Will continue to address and progress towards LTG.    Personal Factors and Comorbidities Comorbidity 3+    Comorbidities muscle spasm, cervical stenosis of spinal canal, neuropathy, hyperCKemia    Examination-Activity Limitations Locomotion Level    Examination-Participation Restrictions Community  Activity;Volunteer    Stability/Clinical Decision Making Stable/Uncomplicated    Rehab Potential Good     PT Frequency 1x / week    PT Duration 8 weeks    PT Treatment/Interventions Cryotherapy;Moist Heat;Therapeutic exercise;Neuromuscular re-education;Therapeutic activities;Functional mobility training;Gait training;Patient/family education;Manual techniques;Passive range of motion;Vestibular;Canalith Repostioning;Dry needling;Balance training;Energy conservation;Spinal Manipulations;Joint Manipulations    PT Next Visit Plan SciFit to warm up.  How are standing stretches?  Tall kneeling and half kneeling with pads under knees.  Continue hip flexibility and ROM especially hip IR/ER, hip flexor. Rectus femoris length is limited. Right piriformis/quadratus very tight.    PT Home Exercise Plan [id number]    Consulted and Agree with Plan of Care Patient             Patient will benefit from skilled therapeutic intervention in order to improve the following deficits and impairments:  Impaired flexibility, Hypermobility, Pain, Dizziness, Increased muscle spasms, Abnormal gait  Visit Diagnosis: Muscle weakness (generalized)  Difficulty in walking, not elsewhere classified     Problem List There are no problems to display for this patient.   Rico Junker, PT, DPT 01/15/21    9:48 AM    [facility] [address] [place], [place], [address] Phone: [phone]   Fax:  [phone]  Name: [name] MRN: [id number] Date of Birth: [birth year]

## 2021-01-22 ENCOUNTER — Ambulatory Visit: Payer: Medicare Other | Admitting: Physical Therapy

## 2021-01-22 ENCOUNTER — Other Ambulatory Visit: Payer: Self-pay

## 2021-01-22 DIAGNOSIS — R293 Abnormal posture: Secondary | ICD-10-CM

## 2021-01-22 DIAGNOSIS — M6281 Muscle weakness (generalized): Secondary | ICD-10-CM | POA: Diagnosis not present

## 2021-01-22 DIAGNOSIS — R42 Dizziness and giddiness: Secondary | ICD-10-CM | POA: Diagnosis not present

## 2021-01-22 DIAGNOSIS — R262 Difficulty in walking, not elsewhere classified: Secondary | ICD-10-CM

## 2021-01-22 NOTE — Patient Instructions (Addendum)
Access Code: [email or web address] URL: [email or web address] Date: 01/22/2021 Prepared by: Misty Stanley  Program Notes Look into re-joining the [employer] for seated SciFit/NuStep, walking and pool exercises.   Exercises Supine Single Knee to Chest Stretch - 3 x daily - 7 x weekly - 1 sets - 3 reps - 30 sec hold Supine Bridge - 1 x daily - 5 x weekly - 1 sets - 10 reps Seated Posterior Pelvic Tilt - 1 x daily - 7 x weekly - 1 sets - 10 reps Standing Bilateral Gastroc Stretch with Step - 1 x daily - 7 x weekly - 2 sets - 30 second hold Standing Hamstring Stretch with Step - 1 x daily - 7 x weekly - 2 sets - 30 seconds hold Hip Flexor Stretch on Step - 1 x daily - 7 x weekly - 2 sets - 30 seconds hold Seated Figure 4 Piriformis Stretch - 1 x daily - 7 x weekly - 2 sets - 30 second hold Tall Kneeling Posterior Pelvic Tilt - 1 x daily - 7 x weekly - 5 reps - 6 second hold

## 2021-01-22 NOTE — Therapy (Signed)
[facility] [address] [place], [place], [address] Phone: [phone]   Fax:  [phone]  Physical Therapy Treatment  Patient Details  Name: [name] MRN: [id number] Date of Birth: 02-14-1941 Referring Provider (PT): Narda Amber   Encounter Date: 01/22/2021   PT End of Session - 01/22/21 0803     Visit Number 8    Number of Visits 14    Date for PT Re-Evaluation 03/03/21    Authorization Type medicare so 10th visit progress note; Recert on 01/08/47 for 1x/week for 8 weeks until 03/03/21    Progress Note Due on Visit 16    PT Start Time 0800    PT Stop Time 0842    PT Time Calculation (min) 42 min    Activity Tolerance Patient tolerated treatment well    Behavior During Therapy [facility] for tasks assessed/performed             Past Medical History:  Diagnosis Date   Diabetes mellitus without complication ([place])    Hypertension    Sarcoidosis     Past Surgical History:  Procedure Laterality Date   ABDOMINAL HYSTERECTOMY     ROTATOR CUFF REPAIR     TRIGGER FINGER RELEASE      There were no vitals filed for this visit.   Subjective Assessment - 01/22/21 0808     Subjective Had a great day yesterday and good day today.  Able to squat without feeling excruciating pain.  Has been doing exercises.  Still a little stiffness and soreness in hip flexor and posterior hip.    Pertinent History muscle spasm, cervical stenosis of spinal canal, neuropathy, hyperCKemia    Patient Stated Goals Pt would like to decrease the stiffness in her legs to improve her function.    Currently in Pain? No/denies                               [facility] Adult PT Treatment/Exercise - 01/22/21 0810       Exercises   Exercises Knee/Hip;Other Exercises    Other Exercises  Performed tall kneeling on mat with bench in front for UE support.  Performed posterior pelvic tilts in tall kneeling with slight hip extension  to stretch bilat hip flexor muscles.  Then performed 10 reps mini tall kneeling squats in midline, x 10 to R, x 10 to L, x 10 alternating between L and R to increase lateral hip mobility.  Discussed how pt could perform safely at home and cues to prevent initiating with lumbar extension.  Transitioned to sitting on physioball performing 2 sets x 10 reps pelvic tilts anterior <> posterior, lateral pelvic tilts with cues to initiate from core and pelvis.  Also performed 2 sets x 10 reps trunk rotations seated on ball.      Knee/Hip Exercises: Aerobic   Nustep SCIFit stepper: level 3 resistance x 5 minutes for warm up prior to stretches and exercises.      Knee/Hip Exercises: Standing   Step Down Right;Left;1 set;10 reps;Hand Hold: 1;Step Height: 6";Limitations    Step Down Limitations backward step downs from box, alternating, with focus on increased hip extension and glute strengthening                    PT Education - 01/22/21 0900     Education Details plan for final 2 visits and plan for D/C, how to safely perform  tall kneeling at home    Person(s) Educated Patient    Methods Explanation    Comprehension Verbalized understanding              PT Short Term Goals - 01/06/21 1006       PT SHORT TERM GOAL #1   Title Pt will demo 0 deg of hip extension in R hip to improve gait and reduce pain    Baseline -25 deg hip extension (01/06/21)    Time 4    Period Weeks    Status Revised    Target Date 02/03/21               PT Long Term Goals - 01/06/21 1007       PT LONG TERM GOAL #1   Title Pt will be instructed in HEP for flexibility and ROM to better manage her tightness and improve her function.    Time 8    Period Weeks    Status On-going    Target Date 03/03/21      PT LONG TERM GOAL #2   Title Pt will report <6/10 pain/tightness in legs with activities for improved function.    Baseline Not consistent yet (good days and bad days)01/06/21    Time 8     Period Weeks    Status On-going    Target Date 03/03/21      PT LONG TERM GOAL #3   Title Pt will increase 6 min walk to >1150' for improved community mobility.    Baseline 12/01/20 1000'    Time 8    Period Weeks    Status On-going    Target Date 03/03/21      PT LONG TERM GOAL #4   Title Vestibular assessment will be performed as warranted and goal written if needed.    Time 8    Period Weeks    Status On-going    Target Date 03/03/21                   Plan - 01/22/21 0902     Clinical Impression Statement Continued to utilize SciFit for LE warm up and continued to focus on WB exercises for both core and proximal hip strengthening and spinal and hip ROM.  Pt reporting pain/tension initially when performing but significant improvement in pain with repetition.  Plan to begin to check LTG and finalize HEP and wellness plan next week in preparation for D/C.    Personal Factors and Comorbidities Comorbidity 3+    Comorbidities muscle spasm, cervical stenosis of spinal canal, neuropathy, hyperCKemia    Examination-Activity Limitations Locomotion Level    Examination-Participation Restrictions Community Activity;Volunteer    Stability/Clinical Decision Making Stable/Uncomplicated    Rehab Potential Good    PT Frequency 1x / week    PT Duration 8 weeks    PT Treatment/Interventions Cryotherapy;Moist Heat;Therapeutic exercise;Neuromuscular re-education;Therapeutic activities;Functional mobility training;Gait training;Patient/family education;Manual techniques;Passive range of motion;Vestibular;Canalith Repostioning;Dry needling;Balance training;Energy conservation;Spinal Manipulations;Joint Manipulations    PT Next Visit Plan Check LTG, finalize HEP and wellness program.  D/C Second visit    PT Home Exercise Plan [email or web address]    Consulted and Agree with Plan of Care Patient             Patient will benefit from skilled therapeutic intervention in order to improve the following  deficits and impairments:  Impaired flexibility, Hypermobility, Pain, Dizziness, Increased muscle spasms, Abnormal gait  Visit Diagnosis: Muscle weakness (generalized)  Difficulty in walking, not elsewhere  classified  Abnormal posture     Problem List There are no problems to display for this patient.   Rico Junker, PT, DPT 01/22/21    9:06 AM    [facility] [address] [place], [place], [address] Phone: [phone]   Fax:  [phone]  Name: [name] MRN: [id number] Date of Birth: October 03, 1940

## 2021-01-25 DIAGNOSIS — I10 Essential (primary) hypertension: Secondary | ICD-10-CM | POA: Diagnosis not present

## 2021-01-25 DIAGNOSIS — E119 Type 2 diabetes mellitus without complications: Secondary | ICD-10-CM | POA: Diagnosis not present

## 2021-01-25 DIAGNOSIS — K219 Gastro-esophageal reflux disease without esophagitis: Secondary | ICD-10-CM | POA: Diagnosis not present

## 2021-01-25 DIAGNOSIS — E785 Hyperlipidemia, unspecified: Secondary | ICD-10-CM | POA: Diagnosis not present

## 2021-01-25 DIAGNOSIS — E139 Other specified diabetes mellitus without complications: Secondary | ICD-10-CM | POA: Diagnosis not present

## 2021-01-25 DIAGNOSIS — E7800 Pure hypercholesterolemia, unspecified: Secondary | ICD-10-CM | POA: Diagnosis not present

## 2021-01-26 ENCOUNTER — Ambulatory Visit: Payer: Medicare Other | Admitting: Physical Therapy

## 2021-01-26 ENCOUNTER — Other Ambulatory Visit: Payer: Self-pay

## 2021-01-26 DIAGNOSIS — R42 Dizziness and giddiness: Secondary | ICD-10-CM

## 2021-01-26 DIAGNOSIS — R293 Abnormal posture: Secondary | ICD-10-CM

## 2021-01-26 DIAGNOSIS — R262 Difficulty in walking, not elsewhere classified: Secondary | ICD-10-CM | POA: Diagnosis not present

## 2021-01-26 DIAGNOSIS — M6281 Muscle weakness (generalized): Secondary | ICD-10-CM | POA: Diagnosis not present

## 2021-01-26 NOTE — Therapy (Signed)
[facility] [address] [place], [place], [address] Phone: [phone]   Fax:  [phone]  Physical Therapy Treatment  Patient Details  Name: [name] MRN: [id number] Date of Birth: July 31, 1941 Referring Provider (PT): Narda Amber   Encounter Date: 01/26/2021   PT End of Session - 01/26/21 0852     Visit Number 9    Number of Visits 14    Date for PT Re-Evaluation 03/03/21    Authorization Type medicare so 10th visit progress note; Recert on 09/30/93 for 1x/week for 8 weeks until 03/03/21    Progress Note Due on Visit 16    PT Start Time 0850    PT Stop Time 0935    PT Time Calculation (min) 45 min    Activity Tolerance Patient tolerated treatment well    Behavior During Therapy [facility] for tasks assessed/performed             Past Medical History:  Diagnosis Date   Diabetes mellitus without complication ([place])    Hypertension    Sarcoidosis     Past Surgical History:  Procedure Laterality Date   ABDOMINAL HYSTERECTOMY     ROTATOR CUFF REPAIR     TRIGGER FINGER RELEASE      There were no vitals filed for this visit.   Subjective Assessment - 01/26/21 0855     Subjective "Not sure how much I can do today, had bad vertigo last night."  Has been putting drops in her eyes for a few weeks.  Took meclizine last night.  LLE is a little tight.    Pertinent History muscle spasm, cervical stenosis of spinal canal, neuropathy, hyperCKemia    Patient Stated Goals Pt would like to decrease the stiffness in her legs to improve her function.    Currently in Pain? No/denies                     Vestibular Assessment - 01/26/21 0856       Positional Testing   Dix-Hallpike Dix-Hallpike Right;Dix-Hallpike Left    Horizontal Canal Testing Horizontal Canal Right;Horizontal Canal Left      Dix-Hallpike Right   Dix-Hallpike Right Duration 0    Dix-Hallpike Right Symptoms No nystagmus      Dix-Hallpike  Left   Dix-Hallpike Left Duration 0    Dix-Hallpike Left Symptoms No nystagmus      Horizontal Canal Right   Horizontal Canal Right Duration 60 seconds    Horizontal Canal Right Symptoms Ageotrophic   less symptomatic side     Horizontal Canal Left   Horizontal Canal Left Duration 60 seconds    Horizontal Canal Left Symptoms Ageotrophic   more symptomatic side                      Vestibular Treatment/Exercise - 01/26/21 0910       Vestibular Treatment/Exercise   Vestibular Treatment Provided Canalith Repositioning    Canalith Repositioning Comment      OTHER   Comment Cupulolith repositioning maneuver for R horizontal canal with bone vibration x 1.  No change in symptoms                   PT Education - 01/26/21 1227     Education Details vestibular findings, plan to address BPPV, addition of one extra visit tomorrow to address if needed.  Advised pt to sleep on L side tonight    Person(s) Educated Patient  Methods Explanation    Comprehension Verbalized understanding              PT Short Term Goals - 01/06/21 1006       PT SHORT TERM GOAL #1   Title Pt will demo 0 deg of hip extension in R hip to improve gait and reduce pain    Baseline -25 deg hip extension (01/06/21)    Time 4    Period Weeks    Status Revised    Target Date 02/03/21               PT Long Term Goals - 01/06/21 1007       PT LONG TERM GOAL #1   Title Pt will be instructed in HEP for flexibility and ROM to better manage her tightness and improve her function.    Time 8    Period Weeks    Status On-going    Target Date 03/03/21      PT LONG TERM GOAL #2   Title Pt will report <6/10 pain/tightness in legs with activities for improved function.    Baseline Not consistent yet (good days and bad days)01/06/21    Time 8    Period Weeks    Status On-going    Target Date 03/03/21      PT LONG TERM GOAL #3   Title Pt will increase 6 min walk to >1150' for  improved community mobility.    Baseline 12/01/20 1000'    Time 8    Period Weeks    Status On-going    Target Date 03/03/21      PT LONG TERM GOAL #4   Title Vestibular assessment will be performed as warranted and goal written if needed.    Time 8    Period Weeks    Status On-going    Target Date 03/03/21                   Plan - 01/26/21 1222     Clinical Impression Statement Treatment session today focused on assessment and treatment of patient's new onset of vertigo.  Pt presented with apogeotropic nystagmus >60 seconds during roll test, less symptomatic to the R side indicating R horizontal canal cupulolithiasis.  Treated x1 with cupulolith repositioning maneuver without conversion to canalithiasis.  Scheduled pt for one more session in the morning if needed.  Will continue to address and pt will likely require a few extra visits to resolve vertigo and complete plan of care.    Personal Factors and Comorbidities Comorbidity 3+    Comorbidities muscle spasm, cervical stenosis of spinal canal, neuropathy, hyperCKemia    Examination-Activity Limitations Locomotion Level    Examination-Participation Restrictions Community Activity;Volunteer    Stability/Clinical Decision Making Stable/Uncomplicated    Rehab Potential Good    PT Frequency 1x / week    PT Duration 8 weeks    PT Treatment/Interventions Cryotherapy;Moist Heat;Therapeutic exercise;Neuromuscular re-education;Therapeutic activities;Functional mobility training;Gait training;Patient/family education;Manual techniques;Passive range of motion;Vestibular;Canalith Repostioning;Dry needling;Balance training;Energy conservation;Spinal Manipulations;Joint Manipulations    PT Next Visit Plan treat R horizontal canal cupulo; begin to check LTG.  Need a few extra visits to continue to address??  POC goes through 8/3 so can add visits without recert if needed.    PT Home Exercise Plan [id number]    Consulted and Agree with Plan of  Care Patient             Patient will benefit from skilled therapeutic intervention in order to  improve the following deficits and impairments:  Impaired flexibility, Hypermobility, Pain, Dizziness, Increased muscle spasms, Abnormal gait  Visit Diagnosis: Muscle weakness (generalized)  Difficulty in walking, not elsewhere classified  Dizziness and giddiness  Abnormal posture     Problem List There are no problems to display for this patient.  Rico Junker, PT, DPT 01/26/21    12:28 PM    [facility] [address] [place], [place], [address] Phone: [phone]   Fax:  [phone]  Name: [name] MRN: [id number] Date of Birth: 1940-11-27

## 2021-01-27 ENCOUNTER — Ambulatory Visit: Payer: Medicare Other

## 2021-01-27 DIAGNOSIS — M6281 Muscle weakness (generalized): Secondary | ICD-10-CM

## 2021-01-27 DIAGNOSIS — R262 Difficulty in walking, not elsewhere classified: Secondary | ICD-10-CM

## 2021-01-27 DIAGNOSIS — R42 Dizziness and giddiness: Secondary | ICD-10-CM

## 2021-01-27 DIAGNOSIS — R293 Abnormal posture: Secondary | ICD-10-CM | POA: Diagnosis not present

## 2021-01-27 NOTE — Therapy (Signed)
[facility] [address] [place], [place], [address] Phone: [phone]   Fax:  [phone]  Physical Therapy Treatment/Progress Note  Patient Details  Name: [name] MRN: [id number] Date of Birth: March 22, 1941 Referring Provider (PT): Narda Amber  Physical Therapy Progress Note   Dates of Reporting Period:12/01/2020 - 01/27/2021  See Note below for Objective Data and Assessment of Progress/Goals.  Thank you for the referral of this patient. Guill[place]ina [place], PT, DPT   Encounter Date: 01/27/2021   PT End of Session - 01/27/21 0847     Visit Number 10    Number of Visits 14    Date for PT Re-Evaluation 03/03/21    Authorization Type medicare so 10th visit progress note; Recert on 02/04/15 for 1x/week for 8 weeks until 03/03/21    Progress Note Due on Visit 16    PT Start Time 0846    PT Stop Time 0922    PT Time Calculation (min) 36 min    Activity Tolerance Patient tolerated treatment well    Behavior During Therapy [facility] for tasks assessed/performed             Past Medical History:  Diagnosis Date   Diabetes mellitus without complication ([place])    Hypertension    Sarcoidosis     Past Surgical History:  Procedure Laterality Date   ABDOMINAL HYSTERECTOMY     ROTATOR CUFF REPAIR     TRIGGER FINGER RELEASE      There were no vitals filed for this visit.   Subjective Assessment - 01/27/21 0848     Subjective Patient reports that the dizziness improved significantly, minor dizziness with bending forward. Did sleep on her L side.    Pertinent History muscle spasm, cervical stenosis of spinal canal, neuropathy, hyperCKemia    Patient Stated Goals Pt would like to decrease the stiffness in her legs to improve her function.    Currently in Pain? No/denies                     Vestibular Assessment - 01/27/21 0001       Positional Testing   Horizontal Canal Testing Horizontal Canal  Right;Horizontal Canal Left      Horizontal Canal Right   Horizontal Canal Right Duration 60 seconds   45 seconds on reassesment; still ageotrophic   Horizontal Canal Right Symptoms Ageotrophic   less symptomatic     Horizontal Canal Left   Horizontal Canal Left Duration 60 seconds   still 60 seconds on reassesment, still increased intensity   Horizontal Canal Left Symptoms Ageotrophic   more symptomatic                      Vestibular Treatment/Exercise - 01/27/21 0001       Vestibular Treatment/Exercise   Vestibular Treatment Provided Canalith Repositioning    Canalith Repositioning Comment;Appiani Right      Appiani Right   Number of Reps  1    Overall Response  Improved Symptoms    Response Details  improvement in symptoms, no resolution.      OTHER   Comment Completed Gufoni for R Horizontal Canal with bone vibration x 2. Improvements noted in nystagmus with second treatment.                   PT Education - 01/27/21 0925     Education Details continue to address BPPV; advised to continue to sleep on L side  Person(s) Educated Patient    Methods Explanation    Comprehension Verbalized understanding              PT Short Term Goals - 01/06/21 1006       PT SHORT TERM GOAL #1   Title Pt will demo 0 deg of hip extension in R hip to improve gait and reduce pain    Baseline -25 deg hip extension (01/06/21)    Time 4    Period Weeks    Status Revised    Target Date 02/03/21               PT Long Term Goals - 01/06/21 1007       PT LONG TERM GOAL #1   Title Pt will be instructed in HEP for flexibility and ROM to better manage her tightness and improve her function.    Time 8    Period Weeks    Status On-going    Target Date 03/03/21      PT LONG TERM GOAL #2   Title Pt will report <6/10 pain/tightness in legs with activities for improved function.    Baseline Not consistent yet (good days and bad days)01/06/21    Time 8     Period Weeks    Status On-going    Target Date 03/03/21      PT LONG TERM GOAL #3   Title Pt will increase 6 min walk to >1150' for improved community mobility.    Baseline 12/01/20 1000'    Time 8    Period Weeks    Status On-going    Target Date 03/03/21      PT LONG TERM GOAL #4   Title Vestibular assessment will be performed as warranted and goal written if needed.    Time 8    Period Weeks    Status On-going    Target Date 03/03/21                   Plan - 01/27/21 0925     Clinical Impression Statement Patient continue to present with agetrophic nystagmus > 60 seconds. Completed Gufoni for R Horizontal Canal x 2, with improvements noted. Upon reassesment nystagmus duration had reduced, but still presenting as ageotrophic. Completed Appiani for R x 1 rep with improvements in symptoms noted, but no resolution. Will continue to address BPPV in future sessions.    Personal Factors and Comorbidities Comorbidity 3+    Comorbidities muscle spasm, cervical stenosis of spinal canal, neuropathy, hyperCKemia    Examination-Activity Limitations Locomotion Level    Examination-Participation Restrictions Community Activity;Volunteer    Stability/Clinical Decision Making Stable/Uncomplicated    Rehab Potential Good    PT Frequency 1x / week    PT Duration 8 weeks    PT Treatment/Interventions Cryotherapy;Moist Heat;Therapeutic exercise;Neuromuscular re-education;Therapeutic activities;Functional mobility training;Gait training;Patient/family education;Manual techniques;Passive range of motion;Vestibular;Canalith Repostioning;Dry needling;Balance training;Energy conservation;Spinal Manipulations;Joint Manipulations    PT Next Visit Plan reassess and treat R horizontal canal cupulo; begin to check LTG.  If BPPV still present, add in extra visits. POC goes through 8/3 so can add visits without recert if needed.    PT Home Exercise Plan [id number]    Consulted and Agree with Plan of Care  Patient             Patient will benefit from skilled therapeutic intervention in order to improve the following deficits and impairments:  Impaired flexibility, Hypermobility, Pain, Dizziness, Increased muscle spasms, Abnormal gait  Visit Diagnosis:  Dizziness and giddiness  Muscle weakness (generalized)  Difficulty in walking, not elsewhere classified     Problem List There are no problems to display for this patient.   Jones Bales, PT, DPT 01/27/2021, 9:29 AM  [facility] [address] [place], [place], [address] Phone: [phone]   Fax:  [phone]  Name: [name] MRN: [id number] Date of Birth: 1940-11-13

## 2021-02-05 ENCOUNTER — Ambulatory Visit: Payer: Medicare Other | Attending: Neurology | Admitting: Physical Therapy

## 2021-02-05 ENCOUNTER — Other Ambulatory Visit: Payer: Self-pay

## 2021-02-05 DIAGNOSIS — R42 Dizziness and giddiness: Secondary | ICD-10-CM | POA: Insufficient documentation

## 2021-02-05 DIAGNOSIS — M6281 Muscle weakness (generalized): Secondary | ICD-10-CM | POA: Diagnosis not present

## 2021-02-05 DIAGNOSIS — R293 Abnormal posture: Secondary | ICD-10-CM | POA: Insufficient documentation

## 2021-02-05 DIAGNOSIS — R262 Difficulty in walking, not elsewhere classified: Secondary | ICD-10-CM | POA: Insufficient documentation

## 2021-02-05 NOTE — Therapy (Signed)
[facility] [address] [place], [place], [address] Phone: [phone]   Fax:  [phone]  Physical Therapy Treatment  Patient Details  Name: [name] MRN: [id number] Date of Birth: 12/18/1940 Referring Provider (PT): Narda Amber   Encounter Date: 02/05/2021   PT End of Session - 02/05/21 0919     Visit Number 11    Number of Visits 14    Date for PT Re-Evaluation 03/03/21    Authorization Type medicare so 10th visit progress note; Recert on 10/05/60 for 1x/week for 8 weeks until 03/03/21    Progress Note Due on Visit 16    PT Start Time 0846    PT Stop Time 0930    PT Time Calculation (min) 44 min    Activity Tolerance Patient tolerated treatment well    Behavior During Therapy [facility] for tasks assessed/performed             Past Medical History:  Diagnosis Date   Diabetes mellitus without complication ([place])    Hypertension    Sarcoidosis     Past Surgical History:  Procedure Laterality Date   ABDOMINAL HYSTERECTOMY     ROTATOR CUFF REPAIR     TRIGGER FINGER RELEASE      There were no vitals filed for this visit.   Subjective Assessment - 02/05/21 0845     Subjective Has been sleeping on L side, tried rolling to R and is still having dizziness.  Was able to travel to [facility] and didn't have any stiffness in her legs even with stooping or prolonged sitting.  Some stiffness today in hip flexors and R hip but not excruciating pain.    Pertinent History muscle spasm, cervical stenosis of spinal canal, neuropathy, hyperCKemia    Patient Stated Goals Pt would like to decrease the stiffness in her legs to improve her function.    Currently in Pain? No/denies                     Vestibular Assessment - 02/05/21 0856       Positional Testing   Horizontal Canal Testing Horizontal Canal Right;Horizontal Canal Left      Horizontal Canal Right   Horizontal Canal Right Duration 60 seconds     Horizontal Canal Right Symptoms Ageotrophic   more symptomatic on R side today     Horizontal Canal Left   Horizontal Canal Left Duration 60 seconds    Horizontal Canal Left Symptoms Ageotrophic   less symptomatic on L side today                      Vestibular Treatment/Exercise - 02/05/21 0901       Vestibular Treatment/Exercise   Vestibular Treatment Provided Canalith Repositioning    Canalith Repositioning Comment      OTHER   Comment Completed Gufoni for L horizontal canal apogeotropic nystagmus >60 seconds; used repeated head turns prior to transitioning to L side and then used bone vibration once on L side.  No change after one rep.  Performed Gufoni with head turns and vibration a second time.  No change after 2 treatments.  Advised pt to sleep on R side for a few nights.  More visits added to address                   PT Education - 02/05/21 1130     Education Details added more visits, 1x/week through 8/3 to continue  to address BPPV.  Change in laterality of BPPV; advised pt to sleep on R side for the next few nights    Person(s) Educated Patient    Methods Explanation    Comprehension Verbalized understanding              PT Short Term Goals - 02/05/21 1128       PT SHORT TERM GOAL #1   Title Pt will demo 0 deg of hip extension in R hip to improve gait and reduce pain    Baseline -25 deg hip extension (01/06/21)    Time 4    Period Weeks    Status On-going    Target Date 02/03/21               PT Long Term Goals - 02/05/21 0939       PT LONG TERM GOAL #1   Title Pt will be instructed in HEP for flexibility and ROM to better manage her tightness and improve her function.  (All LTG due by 03/03/21)    Time 8    Period Weeks    Status On-going      PT LONG TERM GOAL #2   Title Pt will report <6/10 pain/tightness in legs with activities for improved function.    Baseline Not consistent yet (good days and bad days)01/06/21    Time 8     Period Weeks    Status On-going      PT LONG TERM GOAL #3   Title Pt will increase 6 min walk to >1150' for improved community mobility.    Baseline 12/01/20 1000'    Time 8    Period Weeks    Status On-going      PT LONG TERM GOAL #4   Title Pt will demonstrate negative positional testing and will report no dizziness with rolling to R or L    Baseline R and L horizontal canal BPPV    Time 8    Period Weeks    Status Revised                   Plan - 02/05/21 0940     Clinical Impression Statement Pt continues to present with ageotropic nystagmus >60 seconds but today pt more symptomatic on R side after sleeping on L side for multiple nights which may indicate new onset of L horizontal canal cupulolithiasis.  Treated x 2 with bone vibration, repeated head turns and Gufoni with no change in symptoms.  More visits added; pt is making good progress with LE ROM, strength and decreased pain.  Will continue to address and progress towards LTG.    Personal Factors and Comorbidities Comorbidity 3+    Comorbidities muscle spasm, cervical stenosis of spinal canal, neuropathy, hyperCKemia    Examination-Activity Limitations Locomotion Level    Examination-Participation Restrictions Community Activity;Volunteer    Stability/Clinical Decision Making Stable/Uncomplicated    Rehab Potential Good    PT Frequency 1x / week    PT Duration 8 weeks    PT Treatment/Interventions Cryotherapy;Moist Heat;Therapeutic exercise;Neuromuscular re-education;Therapeutic activities;Functional mobility training;Gait training;Patient/family education;Manual techniques;Passive range of motion;Vestibular;Canalith Repostioning;Dry needling;Balance training;Energy conservation;Spinal Manipulations;Joint Manipulations    PT Next Visit Plan I think she now has L horizontal canal cupulo, continue to treat.  Check hip ROM.    PT Home Exercise Plan [email or web address]    Consulted and Agree with Plan of Care Patient              Patient will benefit  from skilled therapeutic intervention in order to improve the following deficits and impairments:  Impaired flexibility, Hypermobility, Pain, Dizziness, Increased muscle spasms, Abnormal gait  Visit Diagnosis: Dizziness and giddiness     Problem List There are no problems to display for this patient.   Rico Junker, PT, DPT 02/05/21    11:31 AM   [facility] [address] [place], [place], [address] Phone: [phone]   Fax:  [phone]  Name: [name] MRN: [id number] Date of Birth: [birth year]

## 2021-02-08 DIAGNOSIS — E785 Hyperlipidemia, unspecified: Secondary | ICD-10-CM | POA: Diagnosis not present

## 2021-02-08 DIAGNOSIS — E7800 Pure hypercholesterolemia, unspecified: Secondary | ICD-10-CM | POA: Diagnosis not present

## 2021-02-08 DIAGNOSIS — K219 Gastro-esophageal reflux disease without esophagitis: Secondary | ICD-10-CM | POA: Diagnosis not present

## 2021-02-08 DIAGNOSIS — I10 Essential (primary) hypertension: Secondary | ICD-10-CM | POA: Diagnosis not present

## 2021-02-08 DIAGNOSIS — E119 Type 2 diabetes mellitus without complications: Secondary | ICD-10-CM | POA: Diagnosis not present

## 2021-02-12 ENCOUNTER — Other Ambulatory Visit: Payer: Self-pay

## 2021-02-12 ENCOUNTER — Ambulatory Visit: Payer: Medicare Other | Admitting: Physical Therapy

## 2021-02-12 DIAGNOSIS — R42 Dizziness and giddiness: Secondary | ICD-10-CM | POA: Diagnosis not present

## 2021-02-12 DIAGNOSIS — R262 Difficulty in walking, not elsewhere classified: Secondary | ICD-10-CM

## 2021-02-12 DIAGNOSIS — M6281 Muscle weakness (generalized): Secondary | ICD-10-CM | POA: Diagnosis not present

## 2021-02-12 DIAGNOSIS — R293 Abnormal posture: Secondary | ICD-10-CM | POA: Diagnosis not present

## 2021-02-12 NOTE — Therapy (Signed)
[facility] [address] [place], [place], [address] Phone: [phone]   Fax:  [phone]  Physical Therapy Treatment  Patient Details  Name: [name] MRN: [id number] Date of Birth: 10/21/1940 Referring Provider (PT): Narda Amber   Encounter Date: 02/12/2021   PT End of Session - 02/12/21 0940     Visit Number 12    Number of Visits 14    Date for PT Re-Evaluation 03/03/21    Authorization Type medicare so 10th visit progress note; Recert on 12/01/72 for 1x/week for 8 weeks until 03/03/21    Progress Note Due on Visit 16    PT Start Time 0851    PT Stop Time 0930    PT Time Calculation (min) 39 min    Activity Tolerance Patient tolerated treatment well    Behavior During Therapy [facility] for tasks assessed/performed             Past Medical History:  Diagnosis Date   Diabetes mellitus without complication ([place])    Hypertension    Sarcoidosis     Past Surgical History:  Procedure Laterality Date   ABDOMINAL HYSTERECTOMY     ROTATOR CUFF REPAIR     TRIGGER FINGER RELEASE      There were no vitals filed for this visit.   Subjective Assessment - 02/12/21 0855     Subjective Only had one episode of dizziness when she laid down on R side.  Still having intermittent days of mm stiffness in LE and UE.    Pertinent History muscle spasm, cervical stenosis of spinal canal, neuropathy, hyperCKemia    Patient Stated Goals Pt would like to decrease the stiffness in her legs to improve her function.    Currently in Pain? No/denies                [facility] PT Assessment - 02/12/21 0911       Special Tests   Other special tests Marcello Moores Test: lacking 5 deg RLE; lacking 5 deg on LLE                 Vestibular Assessment - 02/12/21 0855       Positional Testing   Sidelying Test Sidelying Right;Sidelying Left    Horizontal Canal Testing Horizontal Canal Right;Horizontal Canal Left      Sidelying  Right   Sidelying Right Duration 0    Sidelying Right Symptoms No nystagmus      Sidelying Left   Sidelying Left Duration 0    Sidelying Left Symptoms No nystagmus      Horizontal Canal Right   Horizontal Canal Right Duration 0    Horizontal Canal Right Symptoms Normal      Horizontal Canal Left   Horizontal Canal Left Duration 0    Horizontal Canal Left Symptoms Normal            Pt has not been able to do exercises using stairs for hamstring, gastroc and hip flexor stretch due to heat and rain; stairs are outside.  Adjusted and revised HEP to be able to perform stretches inside on bed with strap.  Reviewed exercises below in bold.  Access Code: [email or web address] URL: [email or web address] Date: 02/12/2021 Prepared by: Misty Stanley  Program Notes Look into re-joining the [employer] for seated SciFit/NuStep, walking and pool exercises.   Exercises Supine Bridge - 1 x daily - 5 x weekly - 1 sets - 10 reps Seated Posterior Pelvic Tilt - 1 x daily - 7  x weekly - 1 sets - 10 reps Seated Figure 4 Piriformis Stretch - 1 x daily - 7 x weekly - 2 sets - 30 second hold Tall Kneeling Posterior Pelvic Tilt - 1 x daily - 7 x weekly - 5 reps - 6 second hold Hooklying Hamstring Stretch with Strap - 1 x daily - 7 x weekly - 2 sets - 30 second hold Hip Adductors and Hamstring Stretch with Strap - 1 x daily - 7 x weekly - 2 sets - 30 second hold Supine ITB Stretch with Strap - 1 x daily - 7 x weekly - 2 sets - 30 second hold Child's Pose Stretch - 1 x daily - 7 x weekly - 2 sets - 30 second hold     PT Education - 02/12/21 0946     Education Details BPPV resolved, updated HEP, plan to D/C within the next couple of visits    Person(s) Educated Patient    Methods Explanation;Demonstration;Handout    Comprehension Verbalized understanding;Returned demonstration              PT Short Term Goals - 02/12/21 0941       PT SHORT TERM GOAL #1   Title Pt will demo 0 deg of hip  extension in R hip to improve gait and reduce pain    Baseline -5 but equal to L    Time 4    Period Weeks    Status Partially Met    Target Date 02/03/21               PT Long Term Goals - 02/05/21 0939       PT LONG TERM GOAL #1   Title Pt will be instructed in HEP for flexibility and ROM to better manage her tightness and improve her function.  (All LTG due by 03/03/21)    Time 8    Period Weeks    Status On-going      PT LONG TERM GOAL #2   Title Pt will report <6/10 pain/tightness in legs with activities for improved function.    Baseline Not consistent yet (good days and bad days)01/06/21    Time 8    Period Weeks    Status On-going      PT LONG TERM GOAL #3   Title Pt will increase 6 min walk to >1150' for improved community mobility.    Baseline 12/01/20 1000'    Time 8    Period Weeks    Status On-going      PT LONG TERM GOAL #4   Title Pt will demonstrate negative positional testing and will report no dizziness with rolling to R or L    Baseline R and L horizontal canal BPPV    Time 8    Period Weeks    Status Revised                   Plan - 02/12/21 0941     Clinical Impression Statement Pt demonstrates full resolution of horizontal canal BPPV and demonstrates 20 deg improvement in R hip extension ROM and is now equal to L.  Updated HEP based on progress and to begin to work towards pt having consistent, daily stretching program to maintain gains.  If symptoms continue to be stable at next visit will begin to check LTG and plan for D/C.    Personal Factors and Comorbidities Comorbidity 3+    Comorbidities muscle spasm, cervical stenosis of spinal canal, neuropathy, hyperCKemia  Examination-Activity Limitations Locomotion Level    Examination-Participation Restrictions Community Activity;Volunteer    Stability/Clinical Decision Making Stable/Uncomplicated    Rehab Potential Good    PT Frequency 1x / week    PT Duration 8 weeks    PT  Treatment/Interventions Cryotherapy;Moist Heat;Therapeutic exercise;Neuromuscular re-education;Therapeutic activities;Functional mobility training;Gait training;Patient/family education;Manual techniques;Passive range of motion;Vestibular;Canalith Repostioning;Dry needling;Balance training;Energy conservation;Spinal Manipulations;Joint Manipulations    PT Next Visit Plan If dizziness has not returned and other symptoms are stable begin to check LTG and plan for D/C.    PT Home Exercise Plan [email or web address]    Consulted and Agree with Plan of Care Patient             Patient will benefit from skilled therapeutic intervention in order to improve the following deficits and impairments:  Impaired flexibility, Hypermobility, Pain, Dizziness, Increased muscle spasms, Abnormal gait  Visit Diagnosis: Dizziness and giddiness  Muscle weakness (generalized)  Difficulty in walking, not elsewhere classified  Abnormal posture     Problem List There are no problems to display for this patient.   Rico Junker, PT, DPT 02/12/21    9:48 AM    [facility] [address] [place], [place], [address] Phone: [phone]   Fax:  [phone]  Name: [name] MRN: [id number] Date of Birth: 14-Apr-1941

## 2021-02-12 NOTE — Patient Instructions (Signed)
Access Code: [email or web address] URL: [email or web address] Date: 02/12/2021 Prepared by: Misty Stanley  Program Notes Look into re-joining the [employer] for seated SciFit/NuStep, walking and pool exercises.   Exercises Supine Bridge - 1 x daily - 5 x weekly - 1 sets - 10 reps Seated Posterior Pelvic Tilt - 1 x daily - 7 x weekly - 1 sets - 10 reps Seated Figure 4 Piriformis Stretch - 1 x daily - 7 x weekly - 2 sets - 30 second hold Tall Kneeling Posterior Pelvic Tilt - 1 x daily - 7 x weekly - 5 reps - 6 second hold Hooklying Hamstring Stretch with Strap - 1 x daily - 7 x weekly - 2 sets - 30 second hold Hip Adductors and Hamstring Stretch with Strap - 1 x daily - 7 x weekly - 2 sets - 30 second hold Supine ITB Stretch with Strap - 1 x daily - 7 x weekly - 2 sets - 30 second hold Child's Pose Stretch - 1 x daily - 7 x weekly - 2 sets - 30 second hold

## 2021-02-17 ENCOUNTER — Ambulatory Visit: Payer: Medicare Other

## 2021-02-18 ENCOUNTER — Encounter: Payer: Self-pay | Admitting: Neurology

## 2021-02-18 ENCOUNTER — Other Ambulatory Visit: Payer: Self-pay

## 2021-02-18 ENCOUNTER — Ambulatory Visit (INDEPENDENT_AMBULATORY_CARE_PROVIDER_SITE_OTHER): Payer: Medicare Other | Admitting: Neurology

## 2021-02-18 VITALS — BP 137/66 | HR 83 | Resp 20 | Ht 65.0 in | Wt 157.0 lb

## 2021-02-18 DIAGNOSIS — R748 Abnormal levels of other serum enzymes: Secondary | ICD-10-CM

## 2021-02-18 DIAGNOSIS — G629 Polyneuropathy, unspecified: Secondary | ICD-10-CM

## 2021-02-18 DIAGNOSIS — M4802 Spinal stenosis, cervical region: Secondary | ICD-10-CM | POA: Diagnosis not present

## 2021-02-18 DIAGNOSIS — M62838 Other muscle spasm: Secondary | ICD-10-CM | POA: Diagnosis not present

## 2021-02-18 NOTE — Progress Notes (Signed)
Follow-up Visit   Date: 02/18/21   [name] MRN: [id number] DOB: Oct 04, 1940   Interim History: [name] is a 80 y.o. right-handed female returning to the clinic for follow-up of cervical canal stenosis at C4-5, hyperCKemia, and muscle cramps.  The patient was accompanied to the clinic by self.   History of present illness: Starting in around June 2021, she began having left thigh muscle spasms and sensation that the muscle is moving under her skin.  She does not observe muscle twitching.  Mornings are especially difficult because of generalized stiffness in the legs, for example, she feels as if she could get stuck, if she tried to bend down.  Prolonged sitting, standing, and cold temperatures cause her legs to feel tight.  She does not have symptoms when active, such as walking.   She is able to climb stairs and get out of a chair without difficulty.  No falls, dark colored urine.  She saw her PCP where CK level was found to be elevated [phone]. She was referred to rheumatology whose evaluation was negative.   Symptoms have improved over the past month, occurring less frequently. No medication changes or preceding illness. She is not on statin therapy.  COVID vaccine received in Jan/Feb 2021.    UPDATE 02/18/2021:  She reports improved muscle pain and stiffness since doing physical therapy. She still has some soreness at the proximal leg with squatting and standing, but overall her pain has significantly improved.   She is compliant with home exercises. No new complaints.   Medications:  Current Outpatient Medications on File Prior to Visit  Medication Sig Dispense Refill   amLODipine (NORVASC) 5 MG tablet Take 5 mg by mouth daily.     aspirin 81 MG EC tablet Take 81 mg by mouth daily. Swallow whole.     chlorthalidone (HYGROTON) 25 MG tablet Take 25 mg by mouth daily.     ketorolac (ACULAR) 0.5 % ophthalmic solution Place 1 drop into the left eye 4 (four) times  daily.     metFORMIN (GLUCOPHAGE) 1000 MG tablet Take 1,000 mg by mouth daily with breakfast.      Multiple Vitamins-Minerals (HAIR SKIN AND NAILS FORMULA PO) Take by mouth.     PRALUENT 150 MG/ML SOAJ SMARTSIG:150 Milligram(s) SUB-Q Every 2 Weeks     tiZANidine (ZANAFLEX) 2 MG tablet TAKE 1 TABLET BY MOUTH AT BEDTIME AS NEEDED FOR MUSCLE SPASMS. (Patient not taking: Reported on 02/18/2021) 90 tablet 3   No current facility-administered medications on file prior to visit.    Allergies:  Allergies  Allergen Reactions   Centrum Silver [Actical]     Rash    Cozaar [Losartan]     Cough, muscle aches, and stomach cramps   Crestor [Rosuvastatin]     NVD   Lipitor [Atorvastatin]     Rash    Protonix [Pantoprazole]     Back pain   Simvastatin     Rash    Sulfa Antibiotics     Unsure of side effects   Zetia [Ezetimibe]     Nausea   Multivitamin+ [Multiple Vitamin] Rash    Vital Signs:  BP 137/66   Pulse 83   Resp 20   Ht 5\' 5"  (1.651 m)   Wt 157 lb (71.2 kg)   SpO2 98%   BMI 26.13 kg/m   Neurological Exam: MENTAL STATUS including orientation to time, place, person, recent and remote memory, attention span and concentration, language, and fund  of knowledge is normal.  Speech is not dysarthric.  CRANIAL NERVES:  No visual field defects.  Pupils equal round and reactive to light.  Normal conjugate, extra-ocular eye movements in all directions of gaze.  No ptosis.  Face is symmetric. No eyelid myotonia.   MOTOR:  Motor strength is 5/5 in all extremities.  No atrophy, fasciculations or abnormal movements.  No pronator drift.  Tone is normal.    MSRs:  Reflexes are 3+/4 throughout, except absent at the ankles.  SENSORY: Vibration is reduced at the ankles bilaterally.  COORDINATION/GAIT:    Intact rapid alternating movements bilaterally.  Gait narrow based and stable.   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 There is no evidence of any diffuse myopathy or cervical/lumbosacral radiculopathy affecting the right side.  MRI cervical spine 05/10/2020: 1. Prior ACDF at C5-6 without residual or recurrent stenosis. 2. Adjacent segment disease with progressive 3 mm retrolisthesis of C4 on C5 with resultant moderate spinal stenosis, with moderate to severe left worse than right C5 foraminal stenosis. 3. Left eccentric disc osteophyte at C3-4 with resultant mild canal and bilateral C4 foraminal stenosis.   Labs 11/19/2020:  4*, vitamin B12 389, CK 709*, CRP 1.7, folate 3.8, SPEP with IFE - polyclonal increase in immunoglobulins, GAD65 negative  Lab Results  Component Value Date   ESRSEDRATE 94 (H) 11/19/2020   Lab Results  Component Value Date   VITAMINB12 389 11/19/2020   Lab Results  Component Value Date   CKTOTAL 709 (H) 11/19/2020     IMPRESSION/PLAN: 1.  Muscle cramps, myalgia, leg stiffness in the setting of benign hyperckemia (CK [phone]) .  No evidence of myopathy on EMG.  GAD65 for stiffperson syndrome is negative.  Rheumatology evaluation for hyperCKemia has been unremarkable.  I discussed doing genetic testing for myotonic dystrophy.  She was provided [place] test code and contact information to determine cost to her and will let me know, if she would like to proceed.  In the meantime, continue home PT exercises, as this seems to be very helpful in alleviating some of her pain.   2. Cervical canal stenosis at C4-5 above the level of her prior fusion, asymptomatic  3.  Neuropathy, diabetic  - Patient educated on daily foot inspection, fall prevention, and safety precautions around the home.  Return to clinic in 3 months.   Total time spent reviewing records, interview, history/exam, documentation, and coordination of care on day of encounter:  20 min   Thank you for allowing me to participate in patient's care.  If I  can answer any additional questions, I would be pleased to do so.    Sincerely,    Jessamine Barcia K. Posey Pronto, DO

## 2021-02-18 NOTE — Patient Instructions (Addendum)
Please call [employer] customer service at [phone] with test code 110 (CNBP DNA test) and test code 108 (DMPK DNA test) to determine cost to you.  If you would like to proceed with testing, please let me know.   Return to clinic 3 month

## 2021-02-22 ENCOUNTER — Other Ambulatory Visit: Payer: Self-pay

## 2021-02-22 ENCOUNTER — Ambulatory Visit: Payer: Medicare Other | Admitting: Physical Therapy

## 2021-02-22 DIAGNOSIS — M6281 Muscle weakness (generalized): Secondary | ICD-10-CM

## 2021-02-22 DIAGNOSIS — R293 Abnormal posture: Secondary | ICD-10-CM | POA: Diagnosis not present

## 2021-02-22 DIAGNOSIS — R262 Difficulty in walking, not elsewhere classified: Secondary | ICD-10-CM | POA: Diagnosis not present

## 2021-02-22 DIAGNOSIS — R42 Dizziness and giddiness: Secondary | ICD-10-CM | POA: Diagnosis not present

## 2021-02-22 NOTE — Patient Instructions (Addendum)
Access Code: [email or web address] URL: [email or web address] Date: 02/22/2021 Prepared by: Misty Stanley  Program Notes Look into re-joining the [employer] for seated SciFit/NuStep, walking and pool exercises.   Exercises Supine Bridge - 1 x daily - 5 x weekly - 1 sets - 10 reps Seated Posterior Pelvic Tilt - 1 x daily - 7 x weekly - 1 sets - 10 reps Seated Figure 4 Piriformis Stretch - 2 x daily - 7 x weekly - 2 sets - 30 second hold Hooklying Hamstring Stretch with Strap - 2 x daily - 7 x weekly - 2 sets - 30 second hold Hip Adductors and Hamstring Stretch with Strap - 2 x daily - 7 x weekly - 2 sets - 30 second hold Supine ITB Stretch with Strap - 2 x daily - 7 x weekly - 2 sets - 30 second hold Child's Pose Stretch - 2 x daily - 7 x weekly - 2 sets - 30 second hold Hip Flexor Stretch at Edge of Bed - 2 x daily - 7 x weekly - 2 sets - 30 second hold

## 2021-02-22 NOTE — Therapy (Signed)
[facility] [address] [place], [place], [address] Phone: [phone]   Fax:  [phone]  Physical Therapy Treatment and D/C Summary  Patient Details  Name: [name] MRN: [id number] Date of Birth: October 04, 1940 Referring Provider (PT): Narda Amber   Encounter Date: 02/22/2021   PT End of Session - 02/22/21 1017     Visit Number 13    Number of Visits 14    Date for PT Re-Evaluation 03/03/21    Authorization Type medicare so 10th visit progress note; Recert on 01/07/61 for 1x/week for 8 weeks until 03/03/21    Progress Note Due on Visit 16    PT Start Time 1017    PT Stop Time 1055    PT Time Calculation (min) 38 min    Activity Tolerance Patient tolerated treatment well    Behavior During Therapy [facility] for tasks assessed/performed             Past Medical History:  Diagnosis Date   Diabetes mellitus without complication ([place])    Hypertension    Sarcoidosis     Past Surgical History:  Procedure Laterality Date   ABDOMINAL HYSTERECTOMY     ROTATOR CUFF REPAIR     TRIGGER FINGER RELEASE      There were no vitals filed for this visit.   Subjective Assessment - 02/22/21 1018     Subjective No further dizziness.  Has been doing new exercises 2x/day and feels like they are helping but has experienced some new pain on lateral R knee.  Went to see neurology who is recommending genetic testing.    Pertinent History muscle spasm, cervical stenosis of spinal canal, neuropathy, hyperCKemia    Patient Stated Goals Pt would like to decrease the stiffness in her legs to improve her function.    Currently in Pain? No/denies                [facility] PT Assessment - 02/22/21 1038       6 Minute Walk- Baseline   6 Minute Walk- Baseline yes    HR (bpm) 67    02 Sat (%RA) 98 %    Modified Borg Scale for Dyspnea 0- Nothing at all    Perceived Rate of Exertion (Borg) 7- Very, very light      6 Minute walk-  Post Test   6 Minute Walk Post Test yes    HR (bpm) 113    02 Sat (%RA) 97 %    Modified Borg Scale for Dyspnea 0- Nothing at all    Perceived Rate of Exertion (Borg) 11- Fairly light      6 minute walk test results    Aerobic Endurance Distance Walked 1413    Endurance additional comments outside without AD; no pain in LE beginning or at end of distance              [facility] Adult PT Treatment/Exercise - 02/22/21 1050       Exercises   Exercises Knee/Hip    Other Exercises  Reviewed and corrected IT band stretch in supine to decrease strain on lateral knee.  Added supine hip flexor stretch back to HEP and progressed with contralateral hip/knee flexion.            Access Code: [email or web address] URL: [email or web address] Date: 02/22/2021 Prepared by: Misty Stanley  Program Notes Look into re-joining the [employer] for seated SciFit/NuStep, walking and pool exercises.   Exercises Supine Bridge - 1 x daily -  5 x weekly - 1 sets - 10 reps Seated Posterior Pelvic Tilt - 1 x daily - 7 x weekly - 1 sets - 10 reps Seated Figure 4 Piriformis Stretch - 2 x daily - 7 x weekly - 2 sets - 30 second hold Hooklying Hamstring Stretch with Strap - 2 x daily - 7 x weekly - 2 sets - 30 second hold Hip Adductors and Hamstring Stretch with Strap - 2 x daily - 7 x weekly - 2 sets - 30 second hold Supine ITB Stretch with Strap - 2 x daily - 7 x weekly - 2 sets - 30 second hold Child's Pose Stretch - 2 x daily - 7 x weekly - 2 sets - 30 second hold Hip Flexor Stretch at Edge of Bed - 2 x daily - 7 x weekly - 2 sets - 30 second hold      PT Education - 02/22/21 1034     Education Details reviewed IT band stretch and progressed hip flexor stretch; final HEP, progress towards goals and D/C today    Person(s) Educated Patient    Methods Explanation;Demonstration;Handout    Comprehension Verbalized understanding;Returned demonstration              PT Short Term Goals - 02/12/21 0941        PT SHORT TERM GOAL #1   Title Pt will demo 0 deg of hip extension in R hip to improve gait and reduce pain    Baseline -5 but equal to L    Time 4    Period Weeks    Status Partially Met    Target Date 02/03/21               PT Long Term Goals - 02/22/21 1050       PT LONG TERM GOAL #1   Title Pt will be instructed in HEP for flexibility and ROM to better manage her tightness and improve her function.  (All LTG due by 03/03/21)    Time 8    Period Weeks    Status Achieved      PT LONG TERM GOAL #2   Title Pt will report <6/10 pain/tightness in legs with activities for improved function.    Time 8    Period Weeks    Status Achieved      PT LONG TERM GOAL #3   Title Pt will increase 6 min walk to >1150' for improved community mobility.    Baseline 1413'    Time 8    Period Weeks    Status Achieved      PT LONG TERM GOAL #4   Title Pt will demonstrate negative positional testing and will report no dizziness with rolling to R or L    Baseline R and L horizontal canal BPPV    Time 8    Period Weeks    Status Achieved                   Plan - 02/22/21 1057     Clinical Impression Statement Pt has made excellent progress and has met all LTG.  Pt is negative for positional vertigo and reports resolution of pain and stiffness in thighs and lower legs.  HEP updated to add hip flexor stretch back in to address ongoing stiffness in anterior hip.  Pt is independent with final HEP and plans to join [employer] to continue walking and use of aquatics for ongoing wellness.  Pt is ready for  D/C today.  Discussed indications to return to therapy if needed.    Personal Factors and Comorbidities Comorbidity 3+    Comorbidities muscle spasm, cervical stenosis of spinal canal, neuropathy, hyperCKemia    Examination-Activity Limitations Locomotion Level    Examination-Participation Restrictions Community Activity;Volunteer    Stability/Clinical Decision Making  Stable/Uncomplicated    PT Treatment/Interventions Cryotherapy;Moist Heat;Therapeutic exercise;Neuromuscular re-education;Therapeutic activities;Functional mobility training;Gait training;Patient/family education;Manual techniques;Passive range of motion;Vestibular;Canalith Repostioning;Dry needling;Balance training;Energy conservation;Spinal Manipulations;Joint Manipulations    PT Next Visit Plan D/C today    PT Home Exercise Plan [email or web address]    Consulted and Agree with Plan of Care Patient             Patient will benefit from skilled therapeutic intervention in order to improve the following deficits and impairments:  Impaired flexibility, Hypermobility, Pain, Dizziness, Increased muscle spasms, Abnormal gait  Visit Diagnosis: Muscle weakness (generalized)  Difficulty in walking, not elsewhere classified     Problem List There are no problems to display for this patient.  PHYSICAL THERAPY DISCHARGE SUMMARY  Visits from Start of Care: 13  Current functional level related to goals / functional outcomes: See LTG achievement and impression statement above.   Remaining deficits: Mild stiffness in proximal hips   Education / Equipment: HEP   Patient agrees to discharge. Patient goals were met. Patient is being discharged due to meeting the stated rehab goals.  Rico Junker, PT, DPT 02/22/21    11:02 AM    [facility] [address] [place], [place], [address] Phone: [phone]   Fax:  [phone]  Name: [name] MRN: [id number] Date of Birth: [birth year]

## 2021-02-26 DIAGNOSIS — H35361 Drusen (degenerative) of macula, right eye: Secondary | ICD-10-CM | POA: Diagnosis not present

## 2021-02-26 DIAGNOSIS — H20042 Secondary noninfectious iridocyclitis, left eye: Secondary | ICD-10-CM | POA: Diagnosis not present

## 2021-02-26 DIAGNOSIS — H59032 Cystoid macular edema following cataract surgery, left eye: Secondary | ICD-10-CM | POA: Diagnosis not present

## 2021-02-26 DIAGNOSIS — H43813 Vitreous degeneration, bilateral: Secondary | ICD-10-CM | POA: Diagnosis not present

## 2021-03-03 DIAGNOSIS — E1165 Type 2 diabetes mellitus with hyperglycemia: Secondary | ICD-10-CM | POA: Diagnosis not present

## 2021-03-03 DIAGNOSIS — H20012 Primary iridocyclitis, left eye: Secondary | ICD-10-CM | POA: Diagnosis not present

## 2021-03-03 DIAGNOSIS — H35033 Hypertensive retinopathy, bilateral: Secondary | ICD-10-CM | POA: Diagnosis not present

## 2021-04-22 DIAGNOSIS — E139 Other specified diabetes mellitus without complications: Secondary | ICD-10-CM | POA: Diagnosis not present

## 2021-04-22 DIAGNOSIS — I10 Essential (primary) hypertension: Secondary | ICD-10-CM | POA: Diagnosis not present

## 2021-04-22 DIAGNOSIS — E1169 Type 2 diabetes mellitus with other specified complication: Secondary | ICD-10-CM | POA: Diagnosis not present

## 2021-04-22 DIAGNOSIS — E785 Hyperlipidemia, unspecified: Secondary | ICD-10-CM | POA: Diagnosis not present

## 2021-04-22 DIAGNOSIS — E119 Type 2 diabetes mellitus without complications: Secondary | ICD-10-CM | POA: Diagnosis not present

## 2021-04-22 DIAGNOSIS — E7800 Pure hypercholesterolemia, unspecified: Secondary | ICD-10-CM | POA: Diagnosis not present

## 2021-04-22 DIAGNOSIS — K219 Gastro-esophageal reflux disease without esophagitis: Secondary | ICD-10-CM | POA: Diagnosis not present

## 2021-05-03 DIAGNOSIS — Z23 Encounter for immunization: Secondary | ICD-10-CM | POA: Diagnosis not present

## 2021-05-31 ENCOUNTER — Other Ambulatory Visit: Payer: Self-pay

## 2021-05-31 ENCOUNTER — Encounter: Payer: Self-pay | Admitting: Neurology

## 2021-05-31 ENCOUNTER — Ambulatory Visit (INDEPENDENT_AMBULATORY_CARE_PROVIDER_SITE_OTHER): Payer: Medicare Other | Admitting: Neurology

## 2021-05-31 VITALS — BP 134/71 | HR 75 | Ht 65.0 in | Wt 159.0 lb

## 2021-05-31 DIAGNOSIS — G629 Polyneuropathy, unspecified: Secondary | ICD-10-CM

## 2021-05-31 DIAGNOSIS — R748 Abnormal levels of other serum enzymes: Secondary | ICD-10-CM

## 2021-05-31 DIAGNOSIS — M62838 Other muscle spasm: Secondary | ICD-10-CM | POA: Diagnosis not present

## 2021-05-31 NOTE — Patient Instructions (Addendum)
Start daily stretching program  Return to clinic in 6 months

## 2021-05-31 NOTE — Progress Notes (Signed)
Follow-up Visit   Date: 05/31/21   [name] MRN: [id number] DOB: May 25, 1941   Interim History: [name] is a 80 y.o. right-handed female returning to the clinic for follow-up of cervical canal stenosis at C4-5, hyperCKemia, and muscle cramps.  The patient was accompanied to the clinic by self.  History of present illness: Starting in around June 2021, she began having left thigh muscle spasms and sensation that the muscle is moving under her skin.  She does not observe muscle twitching.  Mornings are especially difficult because of generalized stiffness in the legs, for example, she feels as if she could get stuck, if she tried to bend down.  Prolonged sitting, standing, and cold temperatures cause her legs to feel tight.  She does not have symptoms when active, such as walking.   She is able to climb stairs and get out of a chair without difficulty.  No falls, dark colored urine.  She saw her PCP where CK level was found to be elevated [phone]. She was referred to rheumatology whose evaluation was negative.   Symptoms have improved over the past month, occurring less frequently. No medication changes or preceding illness. She is not on statin therapy.  COVID vaccine received in Jan/Feb 2021.   UPDATE 02/18/2021:  She reports improved muscle pain and stiffness since doing physical therapy. She still has some soreness at the proximal leg with squatting and standing, but overall her pain has significantly improved.   She is compliant with home exercises. No new complaints.   UPDATE 05/31/2021:  She continues to have muscle spasm and soreness in the thighs and arms, which is worse in the morning.  However, compared to previously, her symptoms are much improved and pain is not severe.  She denies any new weakness.  She has noticed that her arms have visible muscle spasm/twitch, if she applies pressure over her forearm.  Medications:  Current Outpatient Medications on File Prior  to Visit  Medication Sig Dispense Refill   amLODipine (NORVASC) 5 MG tablet Take 5 mg by mouth daily.     aspirin 81 MG EC tablet Take 81 mg by mouth daily. Swallow whole.     chlorthalidone (HYGROTON) 25 MG tablet Take 25 mg by mouth daily.     ketorolac (ACULAR) 0.5 % ophthalmic solution Place 1 drop into the left eye 4 (four) times daily.     metFORMIN (GLUCOPHAGE) 1000 MG tablet Take 1,000 mg by mouth daily with breakfast.      Multiple Vitamins-Minerals (HAIR SKIN AND NAILS FORMULA PO) Take by mouth.     PRALUENT 150 MG/ML SOAJ SMARTSIG:150 Milligram(s) SUB-Q Every 2 Weeks     No current facility-administered medications on file prior to visit.    Allergies:  Allergies  Allergen Reactions   Centrum Silver [Actical]     Rash    Cozaar [Losartan]     Cough, muscle aches, and stomach cramps   Crestor [Rosuvastatin]     NVD   Lipitor [Atorvastatin]     Rash    Protonix [Pantoprazole]     Back pain   Simvastatin     Rash    Sulfa Antibiotics     Unsure of side effects   Zetia [Ezetimibe]     Nausea   Multivitamin+ [Multiple Vitamin] Rash    Vital Signs:  BP 134/71   Pulse 75   Ht 5\' 5"  (1.651 m)   Wt 159 lb (72.1 kg)   SpO2 98%  BMI 26.46 kg/m   Neurological Exam: MENTAL STATUS including orientation to time, place, person, recent and remote memory, attention span and concentration, language, and fund of knowledge is normal.  Speech is not dysarthric. No facies.  CRANIAL NERVES:   Pupils equal round and reactive to light.  Normal conjugate, extra-ocular eye movements in all directions of gaze.  No ptosis.  Face is symmetric.   MOTOR:  Motor strength is 5/5 in all extremities.  There is reproducible intermittent muscle spasm of the ECR. No atrophy, fasciculations or abnormal movements.  No pronator drift.  Tone is normal.  Percussion myotonia present at ABP.  No grip myotonia.  MSRs:  Reflexes are 3+/4 throughout, except absent at the ankles.  SENSORY:  hyperesthesia with vibration at the ankles bilaterally.  COORDINATION/GAIT:    Intact rapid alternating movements bilaterally.  Gait narrow based and stable.   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 There is no evidence of any diffuse myopathy or cervical/lumbosacral radiculopathy affecting the right side.  MRI cervical spine 05/10/2020: 1. Prior ACDF at C5-6 without residual or recurrent stenosis. 2. Adjacent segment disease with progressive 3 mm retrolisthesis of C4 on C5 with resultant moderate spinal stenosis, with moderate to severe left worse than right C5 foraminal stenosis. 3. Left eccentric disc osteophyte at C3-4 with resultant mild canal and bilateral C4 foraminal stenosis.   Labs 11/19/2020:  4*, vitamin B12 389, CK 709*, CRP 1.7, folate 3.8, SPEP with IFE - polyclonal increase in immunoglobulins, GAD65 negative  Lab Results  Component Value Date   ESRSEDRATE 94 (H) 11/19/2020   Lab Results  Component Value Date   VITAMINB12 389 11/19/2020   Lab Results  Component Value Date   CKTOTAL 709 (H) 11/19/2020     IMPRESSION/PLAN: Muscle cramps, myalgia, and hyperCKemia (CK 900-1000s).  Ddx continues to include myotonic dystrophy, especially with her percussion mytonia.  Genetic testing was deferred due to no treatment and clinically doing better.  - No evidence of myopathy on EMG  - GAD65 negative  - Rheumatology evaluation negative  - She had most benefit with PT and will restart this if her symptoms get worse  2. Cervical canal stenosis at C4-5 above the level of her prior fusion, asymptomatic  3.  Neuropathy, diabetic, stable and restricted to the feet.  Return to clinic in 6 months.    Thank you for allowing me to participate in patient's care.  If I can answer any additional questions, I would be pleased to do so.     Sincerely,    Aashi Derrington K. Posey Pronto, DO

## 2021-06-14 DIAGNOSIS — Z1231 Encounter for screening mammogram for malignant neoplasm of breast: Secondary | ICD-10-CM | POA: Diagnosis not present

## 2021-06-15 DIAGNOSIS — R002 Palpitations: Secondary | ICD-10-CM | POA: Diagnosis not present

## 2021-06-15 DIAGNOSIS — I471 Supraventricular tachycardia: Secondary | ICD-10-CM | POA: Diagnosis not present

## 2021-06-17 DIAGNOSIS — R002 Palpitations: Secondary | ICD-10-CM | POA: Diagnosis not present

## 2021-06-28 DIAGNOSIS — H43813 Vitreous degeneration, bilateral: Secondary | ICD-10-CM | POA: Diagnosis not present

## 2021-06-28 DIAGNOSIS — H35361 Drusen (degenerative) of macula, right eye: Secondary | ICD-10-CM | POA: Diagnosis not present

## 2021-06-28 DIAGNOSIS — H20042 Secondary noninfectious iridocyclitis, left eye: Secondary | ICD-10-CM | POA: Diagnosis not present

## 2021-06-28 DIAGNOSIS — H59032 Cystoid macular edema following cataract surgery, left eye: Secondary | ICD-10-CM | POA: Diagnosis not present

## 2021-06-29 DIAGNOSIS — R002 Palpitations: Secondary | ICD-10-CM | POA: Diagnosis not present

## 2021-06-29 DIAGNOSIS — I471 Supraventricular tachycardia: Secondary | ICD-10-CM | POA: Diagnosis not present

## 2021-06-30 NOTE — Progress Notes (Signed)
Cardiology Office Note:    Date:  07/01/2021   ID:  [name], DOB 1940/09/17, MRN [id number]  PCP:  Renee Carol, MD  Cardiologist:  Renee Osmond, MD  Electrophysiologist:  None   Referring MD: Renee Carol, MD   Chief Complaint/Reason for Referral: Palpitations  History of Present Illness:     [name] is a 80 y.o. female with the indicated history referred for recommendations regarding palpitations.  Patient was seen by her primary care provider recently with complaints of palpitations.  She denies any chest pain, dyspnea, or presyncope.  A Zio monitor was placed that demonstrated SVT and occasional NSVT and PVCs.  They are bothersome and not associated with chest pain, shortness of breath, or presyncope.  She tells me that they happen when she is hungry arm when she does not get enough sleep.  She has not changed her caffeine intake whatsoever.  She drinks 1 cup of coffee a day.  She denies any exertional angina, exertional dyspnea, presyncope, syncope, paroxysmal general dyspnea, orthopnea, or severe bleeding or bruising.  Past Medical History:  Diagnosis Date   Allergic rhinitis    Cataracts, bilateral    Cervicalgia    Diabetes mellitus without complication (HCC)    Disorder of bone    Esophageal reflux    GERD (gastroesophageal reflux disease)    History of colonic polyps    Hypercholesterolemia    Hypertension    Mixed hyperlipidemia    Muscle cramps    Muscle spasm    Osteopenia    Pain in joint involving hand    Pain in joint involving lower leg    Sarcoidosis    Sarcoidosis    SVT (supraventricular tachycardia) (HCC)    Tinnitus of right ear     Past Surgical History:  Procedure Laterality Date   ABDOMINAL HYSTERECTOMY     ROTATOR CUFF REPAIR     TRIGGER FINGER RELEASE      Current Medications: Current Meds  Medication Sig   aspirin 81 MG EC tablet Take 81 mg by mouth daily. Swallow whole.   chlorthalidone (HYGROTON) 25 MG tablet  Take 25 mg by mouth daily.   ketorolac (ACULAR) 0.5 % ophthalmic solution Place 1 drop into the left eye 4 (four) times daily.   metFORMIN (GLUCOPHAGE) 1000 MG tablet Take 1,000 mg by mouth daily with breakfast.    PRALUENT 150 MG/ML SOAJ SMARTSIG:150 Milligram(s) SUB-Q Every 2 Weeks   [DISCONTINUED] amLODipine (NORVASC) 5 MG tablet Take 5 mg by mouth daily.     Allergies:    Allergies  Allergen Reactions   Centrum Silver [Actical]     Rash    Cozaar [Losartan]     Cough, muscle aches, and stomach cramps   Crestor [Rosuvastatin]     NVD   Lipitor [Atorvastatin]     Rash    Protonix [Pantoprazole]     Back pain   Simvastatin     Rash    Sulfa Antibiotics     Unsure of side effects   Zetia [Ezetimibe]     Nausea   Multivitamin+ [Multiple Vitamin] Rash    Social History   Tobacco Use   Smoking status: Never   Smokeless tobacco: Never  Vaping Use   Vaping Use: Never used  Substance Use Topics   Alcohol use: Never   Drug use: Never     Family History: Family History  Problem Relation Age of Onset   Heart disease Father  ROS:   Please see the history of present illness.    All other systems reviewed and are negative.  EKGs/Labs/Other Studies Reviewed:    The following studies were reviewed today:  EKG: Today's EKG demonstrates sinus rhythm  Imaging studies that I have independently reviewed today: Zio monitor showing SVT and occasional NSVT and PVCs  Recent Labs: No results found for requested labs within last 8760 hours.  Recent Lipid Panel No results found for: CHOL, TRIG, HDL, CHOLHDL, VLDL, LDLCALC, LDLDIRECT  Physical Exam:    VS:  BP (!) 148/80 (BP Location: Left Arm, Patient Position: Sitting, Cuff Size: Normal)   Pulse 77   Ht 5\' 5"  (1.651 m)   Wt 150 lb 3.2 oz (68.1 kg)   SpO2 99%   BMI 24.99 kg/m     Wt Readings from Last 5 Encounters:  07/01/21 150 lb 3.2 oz (68.1 kg)  05/31/21 159 lb (72.1 kg)  02/18/21 157 lb (71.2 kg)   11/19/20 157 lb 6.4 oz (71.4 kg)  04/20/20 155 lb (70.3 kg)    GENERAL:  No apparent distress, AOx3 HEENT:  No carotid bruits, +2 carotid impulses, no scleral icterus CAR: RRR no murmurs, gallops, rubs, or thrills RES:  Clear to auscultation bilaterally ABD:  Soft, nontender, nondistended, positive bowel sounds x 4 VASC:  +2 radial pulses, +2 carotid pulses, palpable pedal pulses NEURO:  CN 2-12 grossly intact; motor and sensory grossly intact PSYCH:  No active depression or anxiety EXT:  No edema, ecchymosis, or cyanosis  ASSESSMENT:    1. Palpitations   2. Hyperlipidemia, unspecified hyperlipidemia type   3. Type 2 diabetes mellitus without complication, without long-term current use of insulin ([place])   4. Sarcoid    PLAN:    Palpitations The patient's Zio monitor was reassuring.  She is not having any atrial fibrillation or sustained ventricular tachycardia.  She has some episodes of SVT.  They do not seem to be bothersome to her and are associated with stresses such as being hungry or tired.  She would like to try to modify her lifestyle.  I did offer her perhaps Toprol-XL but we will defer on this for now.  I will obtain an echocardiogram.  I will see her back in 6 months or earlier if needed.  Hyperlipidemia, unspecified hyperlipidemia type She is on Praluent per PCP.  Type 2 diabetes mellitus without complication, without long-term current use of insulin (HCC) I will start Jardiance 10 mg daily.  I will DC the patient's Norvasc and start lisinopril 5 mg.  She does have some chronic kidney disease so we will check a BMP in 1 week's time.  Sarcoid Sarcoid can certainly affect myocardium and so in the future if she were to develop PVCs, NSVT, or VT cardiac MRI may be in order.   Shared Decision Making/Informed Consent:       Medication Adjustments/Labs and Tests Ordered: Current medicines are reviewed at length with the patient today.  Concerns regarding medicines are  outlined above.   No orders of the defined types were placed in this encounter.   No orders of the defined types were placed in this encounter.   Patient Instructions  Medication Instructions:  Your physician has recommended you make the following change in your medication:  1.) stop amlodipine (Norvasc) 2.) start lisinopril 5 mg - take one tablet daily 3.) start Jardiance 10 mg - take one tablet daily  *If you need a refill on your cardiac medications before your  next appointment, please call your pharmacy*   Lab Work: Please return next Wednesday for lab work Artist)  If you have labs (blood work) drawn today and your tests are completely normal, you will receive your results only by: [place] (if you have MyChart) OR A paper copy in the mail If you have any lab test that is abnormal or we need to change your treatment, we will call you to review the results.   Testing/Procedures: Your physician has requested that you have an echocardiogram. Echocardiography is a painless test that uses sound waves to create images of your heart. It provides your doctor with information about the size and shape of your heart and how well your heart's chambers and valves are working. This procedure takes approximately one hour. There are no restrictions for this procedure.   Follow-Up: At [facility], you and your health needs are our priority.  As part of our continuing mission to provide you with exceptional heart care, we have created designated Provider Care Teams.  These Care Teams include your primary Cardiologist (physician) and Advanced Practice Providers (APPs -  Physician Assistants and Nurse Practitioners) who all work together to provide you with the care you need, when you need it.   Your next appointment:   6 month(s)  The format for your next appointment:   In Person  Provider:   Early Osmond, MD     Other Instructions

## 2021-07-01 ENCOUNTER — Ambulatory Visit (INDEPENDENT_AMBULATORY_CARE_PROVIDER_SITE_OTHER): Payer: Medicare Other | Admitting: Internal Medicine

## 2021-07-01 ENCOUNTER — Other Ambulatory Visit: Payer: Self-pay

## 2021-07-01 ENCOUNTER — Encounter: Payer: Self-pay | Admitting: Internal Medicine

## 2021-07-01 VITALS — BP 148/80 | HR 77 | Ht 65.0 in | Wt 150.2 lb

## 2021-07-01 DIAGNOSIS — E119 Type 2 diabetes mellitus without complications: Secondary | ICD-10-CM

## 2021-07-01 DIAGNOSIS — E785 Hyperlipidemia, unspecified: Secondary | ICD-10-CM

## 2021-07-01 DIAGNOSIS — D869 Sarcoidosis, unspecified: Secondary | ICD-10-CM | POA: Diagnosis not present

## 2021-07-01 DIAGNOSIS — R002 Palpitations: Secondary | ICD-10-CM

## 2021-07-01 MED ORDER — LISINOPRIL 5 MG PO TABS: 5.0000 mg | ORAL_TABLET | Freq: Every day | ORAL | 3 refills | Status: DC

## 2021-07-01 MED ORDER — EMPAGLIFLOZIN 10 MG PO TABS: 10.0000 mg | ORAL_TABLET | Freq: Every day | ORAL | 3 refills | Status: DC

## 2021-07-01 NOTE — Patient Instructions (Signed)
Medication Instructions:  Your physician has recommended you make the following change in your medication:  1.) stop amlodipine (Norvasc) 2.) start lisinopril 5 mg - take one tablet daily 3.) start Jardiance 10 mg - take one tablet daily  *If you need a refill on your cardiac medications before your next appointment, please call your pharmacy*   Lab Work: Please return next Wednesday for lab work Artist)  If you have labs (blood work) drawn today and your tests are completely normal, you will receive your results only by: [employer] (if you have MyChart) OR A paper copy in the mail If you have any lab test that is abnormal or we need to change your treatment, we will call you to review the results.   Testing/Procedures: Your physician has requested that you have an echocardiogram. Echocardiography is a painless test that uses sound waves to create images of your heart. It provides your doctor with information about the size and shape of your heart and how well your heart's chambers and valves are working. This procedure takes approximately one hour. There are no restrictions for this procedure.   Follow-Up: At [facility], you and your health needs are our priority.  As part of our continuing mission to provide you with exceptional heart care, we have created designated Provider Care Teams.  These Care Teams include your primary Cardiologist (physician) and Advanced Practice Providers (APPs -  Physician Assistants and Nurse Practitioners) who all work together to provide you with the care you need, when you need it.   Your next appointment:   6 month(s)  The format for your next appointment:   In Person  Provider:   Early Osmond, MD     Other Instructions

## 2021-07-07 ENCOUNTER — Other Ambulatory Visit: Payer: Medicare Other | Admitting: *Deleted

## 2021-07-07 ENCOUNTER — Other Ambulatory Visit: Payer: Self-pay

## 2021-07-07 DIAGNOSIS — E119 Type 2 diabetes mellitus without complications: Secondary | ICD-10-CM

## 2021-07-07 DIAGNOSIS — E785 Hyperlipidemia, unspecified: Secondary | ICD-10-CM | POA: Diagnosis not present

## 2021-07-07 DIAGNOSIS — R002 Palpitations: Secondary | ICD-10-CM | POA: Diagnosis not present

## 2021-07-07 DIAGNOSIS — D869 Sarcoidosis, unspecified: Secondary | ICD-10-CM

## 2021-07-07 LAB — BASIC METABOLIC PANEL
BUN/Creatinine Ratio: 19 (ref 12–28)
BUN: 26 mg/dL (ref 8–27)
CO2: 27 mmol/L (ref 20–29)
Calcium: 9.1 mg/dL (ref 8.7–10.3)
Chloride: 97 mmol/L (ref 96–106)
Creatinine, Ser: 1.36 mg/dL — ABNORMAL HIGH (ref 0.57–1.00)
Glucose: 99 mg/dL (ref 70–99)
Potassium: 3.6 mmol/L (ref 3.5–5.2)
Sodium: 139 mmol/L (ref 134–144)
eGFR: 40 mL/min/{1.73_m2} — ABNORMAL LOW (ref >59)

## 2021-07-08 ENCOUNTER — Telehealth: Payer: Self-pay | Admitting: *Deleted

## 2021-07-08 DIAGNOSIS — N289 Disorder of kidney and ureter, unspecified: Secondary | ICD-10-CM

## 2021-07-08 NOTE — Telephone Encounter (Signed)
-----   Message from Early Osmond, MD sent at 07/08/2021  7:52 AM EST ----- Her kidneys are a bit weak, I would like her to get another BMP in 2 weeks, cont all meds for now.  Thanks.

## 2021-07-08 NOTE — Telephone Encounter (Signed)
Reviewed labs w patient.  She voices understanding and will repeat BMET on 12/22 when she comes in for echocardiogram.

## 2021-07-21 DIAGNOSIS — I10 Essential (primary) hypertension: Secondary | ICD-10-CM | POA: Diagnosis not present

## 2021-07-21 DIAGNOSIS — E139 Other specified diabetes mellitus without complications: Secondary | ICD-10-CM | POA: Diagnosis not present

## 2021-07-21 DIAGNOSIS — E7800 Pure hypercholesterolemia, unspecified: Secondary | ICD-10-CM | POA: Diagnosis not present

## 2021-07-21 DIAGNOSIS — K219 Gastro-esophageal reflux disease without esophagitis: Secondary | ICD-10-CM | POA: Diagnosis not present

## 2021-07-21 DIAGNOSIS — E1169 Type 2 diabetes mellitus with other specified complication: Secondary | ICD-10-CM | POA: Diagnosis not present

## 2021-07-21 DIAGNOSIS — E785 Hyperlipidemia, unspecified: Secondary | ICD-10-CM | POA: Diagnosis not present

## 2021-07-22 ENCOUNTER — Other Ambulatory Visit: Payer: Self-pay

## 2021-07-22 ENCOUNTER — Ambulatory Visit (HOSPITAL_COMMUNITY): Payer: Medicare Other | Attending: Internal Medicine

## 2021-07-22 ENCOUNTER — Other Ambulatory Visit: Payer: Medicare Other | Admitting: *Deleted

## 2021-07-22 DIAGNOSIS — I361 Nonrheumatic tricuspid (valve) insufficiency: Secondary | ICD-10-CM | POA: Diagnosis not present

## 2021-07-22 DIAGNOSIS — N289 Disorder of kidney and ureter, unspecified: Secondary | ICD-10-CM

## 2021-07-22 DIAGNOSIS — E785 Hyperlipidemia, unspecified: Secondary | ICD-10-CM | POA: Insufficient documentation

## 2021-07-22 DIAGNOSIS — R002 Palpitations: Secondary | ICD-10-CM

## 2021-07-22 DIAGNOSIS — E119 Type 2 diabetes mellitus without complications: Secondary | ICD-10-CM | POA: Diagnosis not present

## 2021-07-22 DIAGNOSIS — D869 Sarcoidosis, unspecified: Secondary | ICD-10-CM | POA: Insufficient documentation

## 2021-07-22 LAB — BASIC METABOLIC PANEL
BUN/Creatinine Ratio: 18 (ref 12–28)
BUN: 21 mg/dL (ref 8–27)
CO2: 28 mmol/L (ref 20–29)
Calcium: 9 mg/dL (ref 8.7–10.3)
Chloride: 96 mmol/L (ref 96–106)
Creatinine, Ser: 1.16 mg/dL — ABNORMAL HIGH (ref 0.57–1.00)
Glucose: 124 mg/dL — ABNORMAL HIGH (ref 70–99)
Potassium: 3.6 mmol/L (ref 3.5–5.2)
Sodium: 139 mmol/L (ref 134–144)
eGFR: 48 mL/min/{1.73_m2} — ABNORMAL LOW (ref >59)

## 2021-07-22 LAB — ECHOCARDIOGRAM COMPLETE
Area-P 1/2: 4.49 cm2
S' Lateral: 3.1 cm

## 2021-08-05 DIAGNOSIS — M791 Myalgia, unspecified site: Secondary | ICD-10-CM | POA: Diagnosis not present

## 2021-08-05 DIAGNOSIS — D860 Sarcoidosis of lung: Secondary | ICD-10-CM | POA: Diagnosis not present

## 2021-08-05 DIAGNOSIS — E1169 Type 2 diabetes mellitus with other specified complication: Secondary | ICD-10-CM | POA: Diagnosis not present

## 2021-08-05 DIAGNOSIS — I10 Essential (primary) hypertension: Secondary | ICD-10-CM | POA: Diagnosis not present

## 2021-08-05 DIAGNOSIS — R252 Cramp and spasm: Secondary | ICD-10-CM | POA: Diagnosis not present

## 2021-08-05 DIAGNOSIS — R748 Abnormal levels of other serum enzymes: Secondary | ICD-10-CM | POA: Diagnosis not present

## 2021-08-05 DIAGNOSIS — E7800 Pure hypercholesterolemia, unspecified: Secondary | ICD-10-CM | POA: Diagnosis not present

## 2021-08-05 DIAGNOSIS — Z0000 Encounter for general adult medical examination without abnormal findings: Secondary | ICD-10-CM | POA: Diagnosis not present

## 2021-08-05 DIAGNOSIS — Z7984 Long term (current) use of oral hypoglycemic drugs: Secondary | ICD-10-CM | POA: Diagnosis not present

## 2021-08-05 DIAGNOSIS — I471 Supraventricular tachycardia: Secondary | ICD-10-CM | POA: Diagnosis not present

## 2021-08-05 DIAGNOSIS — H9311 Tinnitus, right ear: Secondary | ICD-10-CM | POA: Diagnosis not present

## 2021-09-14 DIAGNOSIS — I10 Essential (primary) hypertension: Secondary | ICD-10-CM | POA: Diagnosis not present

## 2021-09-14 DIAGNOSIS — M26629 Arthralgia of temporomandibular joint, unspecified side: Secondary | ICD-10-CM | POA: Diagnosis not present

## 2021-10-18 DIAGNOSIS — H59032 Cystoid macular edema following cataract surgery, left eye: Secondary | ICD-10-CM | POA: Diagnosis not present

## 2021-10-18 DIAGNOSIS — H20042 Secondary noninfectious iridocyclitis, left eye: Secondary | ICD-10-CM | POA: Diagnosis not present

## 2021-10-18 DIAGNOSIS — H35361 Drusen (degenerative) of macula, right eye: Secondary | ICD-10-CM | POA: Diagnosis not present

## 2021-10-18 DIAGNOSIS — H43813 Vitreous degeneration, bilateral: Secondary | ICD-10-CM | POA: Diagnosis not present

## 2021-11-24 ENCOUNTER — Ambulatory Visit: Payer: Medicare Other | Admitting: Neurology

## 2021-11-29 ENCOUNTER — Ambulatory Visit: Payer: Medicare Other | Admitting: Neurology

## 2021-11-30 DIAGNOSIS — I10 Essential (primary) hypertension: Secondary | ICD-10-CM | POA: Diagnosis not present

## 2021-11-30 DIAGNOSIS — E7800 Pure hypercholesterolemia, unspecified: Secondary | ICD-10-CM | POA: Diagnosis not present

## 2022-01-18 NOTE — Progress Notes (Unsigned)
Cardiology Office Note:    Date:  01/18/2022   ID:  [name], DOB 12-Dec-1940, MRN [id number]  PCP:  Seward Carol, MD   [place] Prov[facility] Cardiologist:  Lenna Sciara, MD Referring MD: Seward Carol, MD   Chief Complaint/Reason for Referral: Follow-up palpitations  ASSESSMENT:    1. Palpitations   2. Type 2 diabetes mellitus without complication, without long-term current use of insulin ([place])   3. Hypertension associated with diabetes ([place])   4. Hyperlipidemia associated with type 2 diabetes mellitus (HCC)   5. Stage 3 chronic kidney disease, unspecified whether stage 3a or 3b CKD ([place])     PLAN:    In order of problems listed above: 1.  Palpitations: 2.  Type 2 diabetes: This is being followed by the patient's primary care provider.  Continue aspirin, Jardiance, Praluent, and lisinopril. 3.  Hypertension: Continue lisinopril with goal blood pressure less than 130/80 mmHg. 4.  Hyperlipidemia this is being followed by the patient's primary care provider.  Goal LDL in a diabetic 70. 5.  Chronic kidney disease stage III: Continue lisinopril for renal protection.         {Are you ordering a CV Procedure (e.g. stress test, cath, DCCV, TEE, etc)?   Press F2        :[id number]}   Dispo:  No follow-ups on file.      Medication Adjustments/Labs and Tests Ordered: Current medicines are reviewed at length with the patient today.  Concerns regarding medicines are outlined above.  The following changes have been made:  {PLAN; NO CHANGE:13088:s}   Labs/tests ordered: No orders of the defined types were placed in this encounter.   Medication Changes: No orders of the defined types were placed in this encounter.    Current medicines are reviewed at length with the patient today.  The patient {ACTIONS; HAS/DOES NOT HAVE:19233} concerns regarding medicines.   History of Present Illness:    FOCUSED PROBLEM LIST:   1.  Type 2 diabetes on oral medication 2.   Hypertension 3.  Hyperlipidemia 4.  Sarcoid  The patient is a 81 y.o. female with the indicated medical history here for follow-up.  December 2022: Patient was seen for SVT on a external ZIO monitor.  The patient was doing well.  Her SVT was thought to be due to stress and so no medical prescriptions were pursued.  She was started on Jardiance 10 mg daily, Norvasc was stopped and lisinopril was started for renal protection given her history of diabetes.  A BMP following this was within normal limits.  Today: In the interim an echocardiogram was performed which was reassuring.            Current Medications: No outpatient medications have been marked as taking for the 01/21/22 encounter (Appointment) with Early Osmond, MD.     Allergies:    Centrum silver [actical], Cozaar [losartan], Crestor [rosuvastatin], Lipitor [atorvastatin], Protonix [pantoprazole], Simvastatin, Sulfa antibiotics, Zetia [ezetimibe], and Multivitamin+ [multiple vitamin]   Social History:   Social History   Tobacco Use   Smoking status: Never   Smokeless tobacco: Never  Vaping Use   Vaping Use: Never used  Substance Use Topics   Alcohol use: Never   Drug use: Never     Family Hx: Family History  Problem Relation Age of Onset   Heart disease Father      Review of Systems:   Please see the history of present illness.    All other systems reviewed and  are negative.     EKGs/Labs/Other Test Reviewed:    EKG:  EKG performed December 2022 that I personally reviewed demonstrates sinus rhythm; EKG performed today that I personally reviewed demonstrates ***.  Prior CV studies: TTE December 2022 with ejection fraction 50 to 55% without significant valvular abnormalities  External ZIO monitor 2022 with SVT and occasional NSVT and PVCs  Other studies Reviewed: Review of the additional studies/records demonstrates: None relevant  Recent Labs: 07/22/2021: BUN 21; Creatinine, Ser 1.16; Potassium  3.6; Sodium 139   Recent Lipid Panel No results found for: "CHOL", "TRIG", "HDL", "LDLCALC", "LDLDIRECT"  Risk Assessment/Calculations:    {Does this patient have ATRIAL FIBRILLATION?[phone]}      Physical Exam:    VS:  There were no vitals taken for this visit.   Wt Readings from Last 3 Encounters:  07/01/21 150 lb 3.2 oz (68.1 kg)  05/31/21 159 lb (72.1 kg)  02/18/21 157 lb (71.2 kg)    GENERAL:  No apparent distress, AOx3 HEENT:  No carotid bruits, +2 carotid impulses, no scleral icterus CAR: RRR Irregular RR*** no murmurs***, gallops, rubs, or thrills RES:  Clear to auscultation bilaterally ABD:  Soft, nontender, nondistended, positive bowel sounds x 4 VASC:  +2 radial pulses, +2 carotid pulses, palpable pedal pulses NEURO:  CN 2-12 grossly intact; motor and sensory grossly intact PSYCH:  No active depression or anxiety EXT:  No edema, ecchymosis, or cyanosis  Signed, Early Osmond, MD  01/18/2022 11:04 AM    [address] [address] [place] [address] [place], [place], [place]  [address] Phone: [phone]; Fax: [phone]   Note:  This document was prepared using Dragon voice recognition software and may include unintentional dictation errors.

## 2022-01-21 ENCOUNTER — Ambulatory Visit (INDEPENDENT_AMBULATORY_CARE_PROVIDER_SITE_OTHER): Payer: Medicare Other | Admitting: Internal Medicine

## 2022-01-21 ENCOUNTER — Encounter: Payer: Self-pay | Admitting: Internal Medicine

## 2022-01-21 VITALS — BP 140/70 | HR 85 | Ht 65.0 in | Wt 157.0 lb

## 2022-01-21 DIAGNOSIS — E785 Hyperlipidemia, unspecified: Secondary | ICD-10-CM | POA: Diagnosis not present

## 2022-01-21 DIAGNOSIS — E1169 Type 2 diabetes mellitus with other specified complication: Secondary | ICD-10-CM

## 2022-01-21 DIAGNOSIS — E119 Type 2 diabetes mellitus without complications: Secondary | ICD-10-CM | POA: Diagnosis not present

## 2022-01-21 DIAGNOSIS — N183 Chronic kidney disease, stage 3 unspecified: Secondary | ICD-10-CM

## 2022-01-21 DIAGNOSIS — R002 Palpitations: Secondary | ICD-10-CM | POA: Diagnosis not present

## 2022-01-21 DIAGNOSIS — E1159 Type 2 diabetes mellitus with other circulatory complications: Secondary | ICD-10-CM | POA: Diagnosis not present

## 2022-01-21 DIAGNOSIS — I152 Hypertension secondary to endocrine disorders: Secondary | ICD-10-CM

## 2022-01-21 DIAGNOSIS — D869 Sarcoidosis, unspecified: Secondary | ICD-10-CM

## 2022-01-31 DIAGNOSIS — H20042 Secondary noninfectious iridocyclitis, left eye: Secondary | ICD-10-CM | POA: Diagnosis not present

## 2022-01-31 DIAGNOSIS — H43813 Vitreous degeneration, bilateral: Secondary | ICD-10-CM | POA: Diagnosis not present

## 2022-01-31 DIAGNOSIS — H59032 Cystoid macular edema following cataract surgery, left eye: Secondary | ICD-10-CM | POA: Diagnosis not present

## 2022-01-31 DIAGNOSIS — H35361 Drusen (degenerative) of macula, right eye: Secondary | ICD-10-CM | POA: Diagnosis not present

## 2022-02-17 DIAGNOSIS — D860 Sarcoidosis of lung: Secondary | ICD-10-CM | POA: Diagnosis not present

## 2022-02-17 DIAGNOSIS — L989 Disorder of the skin and subcutaneous tissue, unspecified: Secondary | ICD-10-CM | POA: Diagnosis not present

## 2022-02-17 DIAGNOSIS — E7800 Pure hypercholesterolemia, unspecified: Secondary | ICD-10-CM | POA: Diagnosis not present

## 2022-02-17 DIAGNOSIS — Z5181 Encounter for therapeutic drug level monitoring: Secondary | ICD-10-CM | POA: Diagnosis not present

## 2022-02-17 DIAGNOSIS — E1169 Type 2 diabetes mellitus with other specified complication: Secondary | ICD-10-CM | POA: Diagnosis not present

## 2022-02-17 DIAGNOSIS — R252 Cramp and spasm: Secondary | ICD-10-CM | POA: Diagnosis not present

## 2022-02-17 DIAGNOSIS — R0789 Other chest pain: Secondary | ICD-10-CM | POA: Diagnosis not present

## 2022-02-17 DIAGNOSIS — I10 Essential (primary) hypertension: Secondary | ICD-10-CM | POA: Diagnosis not present

## 2022-03-04 ENCOUNTER — Ambulatory Visit: Payer: Medicare Other | Admitting: Neurology

## 2022-03-07 DIAGNOSIS — H35033 Hypertensive retinopathy, bilateral: Secondary | ICD-10-CM | POA: Diagnosis not present

## 2022-03-07 DIAGNOSIS — E1165 Type 2 diabetes mellitus with hyperglycemia: Secondary | ICD-10-CM | POA: Diagnosis not present

## 2022-03-07 DIAGNOSIS — H59032 Cystoid macular edema following cataract surgery, left eye: Secondary | ICD-10-CM | POA: Diagnosis not present

## 2022-03-07 DIAGNOSIS — H20012 Primary iridocyclitis, left eye: Secondary | ICD-10-CM | POA: Diagnosis not present

## 2022-03-07 NOTE — Progress Notes (Unsigned)
Follow-up Visit   Date: 03/09/22   [name] MRN: [id number] DOB: 09-11-1940   Interim History: [name] is a 81 y.o. right-handed female returning to the clinic for follow-up of cervical canal stenosis at C4-5, hyperCKemia, and muscle cramps.  The patient was accompanied to the clinic by self.  History of present illness: Starting in around June 2021, she began having left thigh muscle spasms and sensation that the muscle is moving under her skin.  She does not observe muscle twitching.  Mornings are especially difficult because of generalized stiffness in the legs, for example, she feels as if she could get stuck, if she tried to bend down.  Prolonged sitting, standing, and cold temperatures cause her legs to feel tight.  She does not have symptoms when active, such as walking.   She is able to climb stairs and get out of a chair without difficulty.  No falls, dark colored urine.  She saw her PCP where CK level was found to be elevated [phone]. She was referred to rheumatology whose evaluation was negative.   Symptoms have improved over the past month, occurring less frequently. No medication changes or preceding illness. She is not on statin therapy.  COVID vaccine received in Jan/Feb 2021.   UPDATE 02/18/2021:  She reports improved muscle pain and stiffness since doing physical therapy. She still has some soreness at the proximal leg with squatting and standing, but overall her pain has significantly improved.   She is compliant with home exercises. No new complaints.   UPDATE 05/31/2021:  She continues to have muscle spasm and soreness in the thighs and arms, which is worse in the morning.  However, compared to previously, her symptoms are much improved and pain is not severe.  She denies any new weakness.  She has noticed that her arms have visible muscle spasm/twitch, if she applies pressure over her forearm.  UPDATE 03/09/2022: She is here for follow-up.  Muscle spasms  and cramps are stable and still significantly improved from when she was initially seen in 2021.  She continues to have painful muscle cramps, which is triggered by prolonged standing or sitting. When her cramps occur, she has difficulty moving the muscles because of stiffness and pain.   If she remains active and walks, her symptoms are well-controlled.  No new weakness.    Medications:  Current Outpatient Medications on File Prior to Visit  Medication Sig Dispense Refill   amLODipine (NORVASC) 10 MG tablet Take 10 mg by mouth daily.     chlorthalidone (HYGROTON) 25 MG tablet Take 25 mg by mouth daily.     ketorolac (ACULAR) 0.5 % ophthalmic solution Place 1 drop into the left eye 4 (four) times daily.     lisinopril (ZESTRIL) 5 MG tablet Take 1 tablet (5 mg total) by mouth daily. 90 tablet 3   metFORMIN (GLUCOPHAGE) 1000 MG tablet Take 1,000 mg by mouth daily with breakfast.      PRALUENT 150 MG/ML SOAJ SMARTSIG:150 Milligram(s) SUB-Q Every 2 Weeks     No current facility-administered medications on file prior to visit.    Allergies:  Allergies  Allergen Reactions   Centrum Silver [Actical]     Rash    Cozaar [Losartan]     Cough, muscle aches, and stomach cramps   Crestor [Rosuvastatin]     NVD   Lipitor [Atorvastatin]     Rash    Protonix [Pantoprazole]     Back pain   Simvastatin  Rash    Sulfa Antibiotics     Unsure of side effects   Zetia [Ezetimibe]     Nausea   Multivitamin+ [Multiple Vitamin] Rash    Vital Signs:  BP 138/72   Pulse 96   Ht '5\' 5"'$  (1.651 m)   Wt 158 lb (71.7 kg)   SpO2 97%   BMI 26.29 kg/m   Neurological Exam: MENTAL STATUS including orientation to time, place, person, recent and remote memory, attention span and concentration, language, and fund of knowledge is normal.  Speech is not dysarthric. No facies.  CRANIAL NERVES:   Pupils equal round and reactive to light.  Normal conjugate, extra-ocular eye movements in all directions of  gaze.  No ptosis.  Face is symmetric.   MOTOR:  Motor strength is 5/5 in all extremities.  No atrophy, fasciculations or abnormal movements.  No pronator drift.  Tone is normal.  Percussion myotonia present at ABP.  No grip myotonia.  MSRs:  Reflexes are 3+/4 throughout, except absent at the ankles.  COORDINATION/GAIT:    Gait narrow based and stable.   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 There is no evidence of any diffuse myopathy or cervical/lumbosacral radiculopathy affecting the right side.  MRI cervical spine 05/10/2020: 1. Prior ACDF at C5-6 without residual or recurrent stenosis. 2. Adjacent segment disease with progressive 3 mm retrolisthesis of C4 on C5 with resultant moderate spinal stenosis, with moderate to severe left worse than right C5 foraminal stenosis. 3. Left eccentric disc osteophyte at C3-4 with resultant mild canal and bilateral C4 foraminal stenosis.   Labs 11/19/2020:  4*, vitamin B12 389, CK 709*, CRP 1.7, folate 3.8, SPEP with IFE - polyclonal increase in immunoglobulins, GAD65 negative  Lab Results  Component Value Date   ESRSEDRATE 94 (H) 11/19/2020   Lab Results  Component Value Date   VITAMINB12 389 11/19/2020   Lab Results  Component Value Date   CKTOTAL 709 (H) 11/19/2020     IMPRESSION/PLAN: Muscle cramps, myalgias, and hyperCKemia (CK 900-1000s).  Ddx continues to include myotonic dystrophy, especially with her percussion mytonia.  Genetic testing was declined due to no treatment and clinically doing better.  - No evidence of myopathy on EMG  - GAD65 and rheumatological evaluation is negative  - She had most benefit with PT and will restart this if her symptoms get worse  - She has been on tizanidine in the past and this can be restarted, if needed going forward  2. Cervical canal stenosis at C4-5 above the  level of her prior fusion, asymptomatic  3.  Neuropathy, diabetic, stable and restricted to the feet.  Return to clinic in 1 year   Thank you for allowing me to participate in patient's care.  If I can answer any additional questions, I would be pleased to do so.    Sincerely,    Shantinique Picazo K. Posey Pronto, DO

## 2022-03-09 ENCOUNTER — Encounter: Payer: Self-pay | Admitting: Neurology

## 2022-03-09 ENCOUNTER — Ambulatory Visit (INDEPENDENT_AMBULATORY_CARE_PROVIDER_SITE_OTHER): Payer: Medicare Other | Admitting: Neurology

## 2022-03-09 VITALS — BP 138/72 | HR 96 | Ht 65.0 in | Wt 158.0 lb

## 2022-03-09 DIAGNOSIS — R748 Abnormal levels of other serum enzymes: Secondary | ICD-10-CM

## 2022-03-09 DIAGNOSIS — M62838 Other muscle spasm: Secondary | ICD-10-CM | POA: Diagnosis not present

## 2022-03-09 NOTE — Patient Instructions (Signed)
Please call my office if your cramps get worse.  We can send a prescription for muscle relaxer  Return to clinic in 1 year

## 2022-04-20 ENCOUNTER — Encounter: Payer: Self-pay | Admitting: Neurology

## 2022-05-20 DIAGNOSIS — K219 Gastro-esophageal reflux disease without esophagitis: Secondary | ICD-10-CM | POA: Diagnosis not present

## 2022-05-20 DIAGNOSIS — Z1211 Encounter for screening for malignant neoplasm of colon: Secondary | ICD-10-CM | POA: Diagnosis not present

## 2022-05-30 DIAGNOSIS — H43813 Vitreous degeneration, bilateral: Secondary | ICD-10-CM | POA: Diagnosis not present

## 2022-05-30 DIAGNOSIS — H35033 Hypertensive retinopathy, bilateral: Secondary | ICD-10-CM | POA: Diagnosis not present

## 2022-05-30 DIAGNOSIS — H35361 Drusen (degenerative) of macula, right eye: Secondary | ICD-10-CM | POA: Diagnosis not present

## 2022-05-30 DIAGNOSIS — H59032 Cystoid macular edema following cataract surgery, left eye: Secondary | ICD-10-CM | POA: Diagnosis not present

## 2022-06-16 DIAGNOSIS — K2930 Chronic superficial gastritis without bleeding: Secondary | ICD-10-CM | POA: Diagnosis not present

## 2022-06-16 DIAGNOSIS — K219 Gastro-esophageal reflux disease without esophagitis: Secondary | ICD-10-CM | POA: Diagnosis not present

## 2022-06-16 DIAGNOSIS — D175 Benign lipomatous neoplasm of intra-abdominal organs: Secondary | ICD-10-CM | POA: Diagnosis not present

## 2022-06-16 DIAGNOSIS — Z86010 Personal history of colonic polyps: Secondary | ICD-10-CM | POA: Diagnosis not present

## 2022-06-16 DIAGNOSIS — Z09 Encounter for follow-up examination after completed treatment for conditions other than malignant neoplasm: Secondary | ICD-10-CM | POA: Diagnosis not present

## 2022-06-16 DIAGNOSIS — K449 Diaphragmatic hernia without obstruction or gangrene: Secondary | ICD-10-CM | POA: Diagnosis not present

## 2022-06-16 DIAGNOSIS — K648 Other hemorrhoids: Secondary | ICD-10-CM | POA: Diagnosis not present

## 2022-06-16 DIAGNOSIS — K635 Polyp of colon: Secondary | ICD-10-CM | POA: Diagnosis not present

## 2022-06-16 DIAGNOSIS — K259 Gastric ulcer, unspecified as acute or chronic, without hemorrhage or perforation: Secondary | ICD-10-CM | POA: Diagnosis not present

## 2022-06-21 DIAGNOSIS — K2930 Chronic superficial gastritis without bleeding: Secondary | ICD-10-CM | POA: Diagnosis not present

## 2022-06-21 DIAGNOSIS — K635 Polyp of colon: Secondary | ICD-10-CM | POA: Diagnosis not present

## 2022-12-13 ENCOUNTER — Other Ambulatory Visit: Payer: 59

## 2022-12-13 ENCOUNTER — Encounter: Payer: Self-pay | Admitting: Neurology

## 2022-12-13 ENCOUNTER — Ambulatory Visit (INDEPENDENT_AMBULATORY_CARE_PROVIDER_SITE_OTHER): Payer: Medicare HMO | Admitting: Neurology

## 2022-12-13 VITALS — BP 155/76 | HR 103 | Ht 65.0 in | Wt 160.0 lb

## 2022-12-13 DIAGNOSIS — M4802 Spinal stenosis, cervical region: Secondary | ICD-10-CM

## 2022-12-13 MED ORDER — TIZANIDINE HCL 2 MG PO TABS: 2.0000 mg | ORAL_TABLET | Freq: Every evening | ORAL | 3 refills | Status: DC | PRN

## 2022-12-13 NOTE — Patient Instructions (Addendum)
MRI cervical spine wo contrast  Start neck PT  Start tizanidine 2mg  at bedtime  Return to clinic in 3 months

## 2022-12-13 NOTE — Progress Notes (Signed)
Follow-up Visit   Date: 12/13/22   MANG PARLIN MRN: [id number] DOB: April 08, 1941   Interim History: [name] is a 82 y.o. right-handed female with cervical canal stenosis s/p ACDF at C5-6 and hyperCKemia returning to the clinic for complaints of neck stiffness and pain.  The patient was accompanied to the clinic by self.  History of present illness: Starting in around June 2021, she began having left thigh muscle spasms and sensation that the muscle is moving under her skin.  She does not observe muscle twitching.  Mornings are especially difficult because of generalized stiffness in the legs, for example, she feels as if she could get stuck, if she tried to bend down.  Prolonged sitting, standing, and cold temperatures cause her legs to feel tight.  She does not have symptoms when active, such as walking.   She is able to climb stairs and get out of a chair without difficulty.  No falls, dark colored urine.  She saw her PCP where CK level was found to be elevated [phone]. She was referred to rheumatology whose evaluation was negative.   Symptoms have improved over the past month, occurring less frequently. No medication changes or preceding illness. She is not on statin therapy.  COVID vaccine received in Jan/Feb 2021.   UPDATE 02/18/2021:  She reports improved muscle pain and stiffness since doing physical therapy. She still has some soreness at the proximal leg with squatting and standing, but overall her pain has significantly improved.   She is compliant with home exercises. No new complaints.   UPDATE 03/09/2022: She is here for follow-up.  Muscle spasms and cramps are stable and still significantly improved from when she was initially seen in 2021.  She continues to have painful muscle cramps, which is triggered by prolonged standing or sitting. When her cramps occur, she has difficulty moving the muscles because of stiffness and pain.   If she remains active and walks, her  symptoms are well-controlled.  No new weakness.    UPDATE 12/13/2022:  Around December, she received two injections of Repatha and developed severe muscle pain similar to what she experienced before.  Symptoms started a few minutes after administering the injection.  She also has head congestion and fluid in her ear. Fortunately, muscle ain has significantly improved.    A month ago, she began having stiffness in her neck and reduced range of motion.  She has tried ibuprofen which helps.  No numbness/tingling. She had known cervical canal stenosis at C4-5 from prior MRI in 2021.   Medications:  Current Outpatient Medications on File Prior to Visit  Medication Sig Dispense Refill   amLODipine (NORVASC) 10 MG tablet Take 10 mg by mouth daily.     chlorthalidone (HYGROTON) 25 MG tablet Take 25 mg by mouth daily.     ketorolac (ACULAR) 0.5 % ophthalmic solution Place 1 drop into the left eye 4 (four) times daily.     lisinopril (ZESTRIL) 5 MG tablet Take 1 tablet (5 mg total) by mouth daily. 90 tablet 3   PRALUENT 150 MG/ML SOAJ SMARTSIG:150 Milligram(s) SUB-Q Every 2 Weeks     No current facility-administered medications on file prior to visit.    Allergies:  Allergies  Allergen Reactions   Centrum Silver [Actical]     Rash    Cozaar [Losartan]     Cough, muscle aches, and stomach cramps   Crestor [Rosuvastatin]     NVD   Lipitor [Atorvastatin]  Rash    Protonix [Pantoprazole]     Back pain   Repatha [Evolocumab]     Muscle Pain, Flu like symptoms    Simvastatin     Rash    Sulfa Antibiotics     Unsure of side effects   Zetia [Ezetimibe]     Nausea   Multivitamin+ [Multiple Vitamin] Rash    Vital Signs:  BP (!) 155/76   Pulse (!) 103   Ht 5\' 5"  (1.651 m)   Wt 160 lb (72.6 kg)   SpO2 96%   BMI 26.63 kg/m   Neurological Exam: MENTAL STATUS including orientation to time, place, person, recent and remote memory, attention span and concentration, language, and fund of  knowledge is normal.  Speech is not dysarthric.   CRANIAL NERVES:   Pupils equal round and reactive to light.  Normal conjugate, extra-ocular eye movements in all directions of gaze.  No ptosis.  Face is symmetric.   MOTOR:  Motor strength is 5/5 in all extremities.  No atrophy, fasciculations or abnormal movements.  No pronator drift.  Tone is normal.    MSRs:  Reflexes are 3+/4 throughout, except absent at the ankles.  COORDINATION/GAIT:    Gait narrow based and stable.   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 There is no evidence of any diffuse myopathy or cervical/lumbosacral radiculopathy affecting the right side.  MRI cervical spine 05/10/2020: 1. Prior ACDF at C5-6 without residual or recurrent stenosis. 2. Adjacent segment disease with progressive 3 mm retrolisthesis of C4 on C5 with resultant moderate spinal stenosis, with moderate to severe left worse than right C5 foraminal stenosis. 3. Left eccentric disc osteophyte at C3-4 with resultant mild canal and bilateral C4 foraminal stenosis.   Labs 11/19/2020:  4*, vitamin B12 389, CK 709*, CRP 1.7, folate 3.8, SPEP with IFE - polyclonal increase in immunoglobulins, GAD65 negative  Lab Results  Component Value Date   ESRSEDRATE 94 (H) 11/19/2020   Lab Results  Component Value Date   VITAMINB12 389 11/19/2020   Lab Results  Component Value Date   CKTOTAL 709 (H) 11/19/2020     IMPRESSION/PLAN: Cervical canal stenosis s/p ACDF C5-6 with adjacent segment stenosis at C4-5 with new neck stiffness and pain.  Exam shows brisk reflexes throughout.    - To assess for progressive stenosis, MRI cervical spine wo contrast will be ordered  - Start neck PT  - Start tizanidine 2mg  at bedtime  Return to clinic in 3 months   Thank you for allowing me to participate in patient's care.  If I can answer  any additional questions, I would be pleased to do so.    Sincerely,    Lindbergh Winkles K. Allena Katz, DO

## 2022-12-30 ENCOUNTER — Encounter: Payer: Self-pay | Admitting: Neurology

## 2023-01-01 ENCOUNTER — Ambulatory Visit
Admission: RE | Admit: 2023-01-01 | Discharge: 2023-01-01 | Disposition: A | Discharge status: Home / Self Care | Payer: 59 | Source: Ambulatory Visit | Attending: Neurology | Admitting: Neurology

## 2023-01-01 DIAGNOSIS — M4802 Spinal stenosis, cervical region: Secondary | ICD-10-CM

## 2023-01-24 NOTE — Progress Notes (Unsigned)
Cardiology Office Note:    Date:  01/25/2023   ID:  [name], DOB [birth year], MRN [id number]  PCP:  Renford Dills, MD   [facility] Providers Cardiologist:  Alverda Skeans, MD Referring MD: Renford Dills, MD   Chief Complaint/Reason for Referral: Follow-up palpitations  ASSESSMENT:    1. Palpitations   2. Type 2 diabetes mellitus without complication, without long-term current use of insulin (HCC)   3. Hypertension associated with diabetes (HCC)   4. Hyperlipidemia associated with type 2 diabetes mellitus (HCC)   5. Stage 3 chronic kidney disease, unspecified whether stage 3a or 3b CKD (HCC)   6. Sarcoid   7. BMI 26.0-26.9,adult      PLAN:    In order of problems listed above: 1.  Palpitations: Resolved 2.  Type 2 diabetes: Patient is off aspirin.  It sounds like it was related to GERD but she tells me this is controlled when she modifies her diet.  She is willing to retrial aspirin 81 mg.  Will start ASA 81mg .  As a diabetic she should be on aspirin 81 mg.  If intolerant, can cahnge to plavix.  Additionally she is off lisinopril and Jardiance.  We will check laboratories today and restart if her GFR is greater than 20.  Given elevated BMI, will refer to pharmacy to consider GLP1-RA. 3.  Hypertension: Blood pressure is elevated today.  She is off lisinopril and on high-dose amlodipine and chlorthalidone.  We will likely restart lisinopril for benefits in diabetic patients in regards to renal protection. 4.  Hyperlipidemia: Check lipids, CMP, Lp(a) today.  She is intolerant of many medications including multiple statins and Praluent.  Will refer to pharmacy for recommendations. 5.  Chronic kidney disease stage III: See discussion above with provisional plan to restart lisinopril and Jardiance for renal protection. 6.  Sarcoid: This does not seem to be affecting her heart.  If there is a suspicion of this we will obtain a cardiac MRI. 7.  Elevated BMI: Refer to pharmacy to  discuss GLP-1 receptor agonist.             Dispo:  No follow-ups on file.      Medication Adjustments/Labs and Tests Ordered: Current medicines are reviewed at length with the patient today.  Concerns regarding medicines are outlined above.  The following changes have been made:  no change   Labs/tests ordered: Orders Placed This Encounter  Procedures   Comprehensive metabolic panel   Lipid panel   Lipoprotein A (LPA)   AMB Referral to [facility] Pharm-D   EKG 12-Lead    Medication Changes: Meds ordered this encounter  Medications   aspirin EC 81 MG tablet    Sig: Take 1 tablet (81 mg total) by mouth daily. Swallow whole.    Dispense:  90 tablet    Refill:  3     Current medicines are reviewed at length with the patient today.  The patient does not have concerns regarding medicines.   History of Present Illness:    FOCUSED PROBLEM LIST:   1.  Type 2 diabetes on oral medication 2.  Hypertension 3.  Hyperlipidemia 4.  Sarcoid 5.  BMI 26  The patient is a 82 y.o. female with the indicated medical history here for follow-up.  [name]: Patient was seen for SVT on a external ZIO monitor.  The patient was doing well.  Her SVT was thought to be due to stress and so no medical therapy was pursued.  She was started on Jardiance 10 mg daily, Norvasc was stopped and lisinopril was started for renal protection given her history of diabetes.  A BMP following this was within normal limits.  [name]: In the interim an echocardiogram was performed which was reassuring.  She tells me that her palpitations are much improved.  She noticed that when she did not eat lunch when volunteering she will get palpitations.  Now she eats lunch on a regular basis.  She denies any chest pain, presyncope, claudication, signs or symptoms of stroke, or need for emergency room visits or hospitalizations.  She is otherwise well without complaints.  She does check her blood pressures at home  and tells me they are in the 120s over 80s.  Plan:  Continue current therapy, consider CMR in future to evaluate for sarcoid if clinically indicated.  Today:  In the interim she saw neurology regarding myalgias and cervical spinal issues.  The patient is doing well.  She denies any chest pain, exertional dyspnea, presyncope, syncope, or palpitations.  Apparently she was taken off of her lisinopril and she is not sure why.  On review of her available laboratories her creatinine in March was 1.28 consistent with CKD stage III.  Additionally she was taking off aspirin 81 mg and she thinks it was because of concomitant complaints of GERD however her GERD was not exacerbated by aspirin.  She fortunately has not required any emergency room visits or hospitalizations recently.  Because of a reaction to Praluent she was placed on prednisone which very much increase her blood sugars.  She is now back on metformin.  She used to be diet controlled.  Current Medications: Current Meds  Medication Sig   amLODipine (NORVASC) 10 MG tablet Take 10 mg by mouth daily.   aspirin EC 81 MG tablet Take 1 tablet (81 mg total) by mouth daily. Swallow whole.   chlorthalidone (HYGROTON) 25 MG tablet Take 25 mg by mouth daily.   ketorolac (ACULAR) 0.5 % ophthalmic solution Place 1 drop into the left eye 4 (four) times daily.   metformin (FORTAMET) 1000 MG (OSM) 24 hr tablet Take 1,000 mg by mouth daily with breakfast.     Allergies:    Praluent [alirocumab], Centrum silver [actical], Cozaar [losartan], Crestor [rosuvastatin], Lipitor [atorvastatin], Protonix [pantoprazole], Repatha [evolocumab], Simvastatin, Sulfa antibiotics, Zetia [ezetimibe], and Multivitamin+ [multiple vitamin]   Social History:   Social History   Tobacco Use   Smoking status: Never   Smokeless tobacco: Never  Vaping Use   Vaping Use: Never used  Substance Use Topics   Alcohol use: Never   Drug use: Never     Family Hx: Family History   Problem Relation Age of Onset   Heart disease Father      Review of Systems:   Please see the history of present illness.    All other systems reviewed and are negative.     EKGs/Labs/Other Test Reviewed:    EKG:   EKG performed today that I personally reviewed demonstrates sinus rhythm  Prior CV studies: TTE [name] with ejection fraction 50 to 55% without significant valvular abnormalities  External ZIO monitor Kane with SVT and occasional NSVT and PVCs  Other studies Reviewed: Review of the additional studies/records demonstrates: No imaging studies available demonstrating aortic atherosclerosis or coronary calcification  Recent Labs: No results found for requested labs within last 365 days.   Recent Lipid Panel No results found for: "CHOL", "TRIG", "HDL", "LDLCALC", "LDLDIRECT"  Risk Assessment/Calculations:  Physical Exam:    VS:  BP (!) 140/65   Pulse 66   Ht 5\' 5"  (1.651 m)   Wt 157 lb (71.2 kg)   SpO2 96%   BMI 26.13 kg/m   HYPERTENSION CONTROL Vitals:   01/25/23 0825 01/25/23 0900  BP: (!) 158/70 (!) 140/65    The patient's blood pressure is elevated above target today.  In order to address the patient's elevated BP: Labs and/or other diagnostics are currently pending prior to making blood pressure medication adjustments.      Wt Readings from Last 3 Encounters:  01/25/23 157 lb (71.2 kg)  12/13/22 160 lb (72.6 kg)  03/09/22 158 lb (71.7 kg)    GENERAL:  No apparent distress, AOx3 HEENT:  No carotid bruits, +2 carotid impulses, no scleral icterus CAR: RRR no murmurs, gallops, rubs, or thrills RES:  Clear to auscultation bilaterally ABD:  Soft, nontender, nondistended, positive bowel sounds x 4 VASC:  +2 radial pulses, +2 carotid pulses, palpable pedal pulses NEURO:  CN 2-12 grossly intact; motor and sensory grossly intact PSYCH:  No active depression or anxiety EXT:  No edema, ecchymosis, or cyanosis  Signed, Orbie Pyo, MD  01/25/2023 9:25 AM    [facility] [address] [place], [place], [place]  [address] Phone: [phone]; Fax: [phone]   Note:  This document was prepared using Dragon voice recognition software and may include unintentional dictation errors.

## 2023-01-25 ENCOUNTER — Ambulatory Visit: Payer: 59 | Attending: Internal Medicine | Admitting: Internal Medicine

## 2023-01-25 ENCOUNTER — Encounter: Payer: Self-pay | Admitting: Internal Medicine

## 2023-01-25 VITALS — BP 140/65 | HR 66 | Ht 65.0 in | Wt 157.0 lb

## 2023-01-25 DIAGNOSIS — E1169 Type 2 diabetes mellitus with other specified complication: Secondary | ICD-10-CM

## 2023-01-25 DIAGNOSIS — R002 Palpitations: Secondary | ICD-10-CM | POA: Diagnosis not present

## 2023-01-25 DIAGNOSIS — E1159 Type 2 diabetes mellitus with other circulatory complications: Secondary | ICD-10-CM | POA: Diagnosis not present

## 2023-01-25 DIAGNOSIS — D869 Sarcoidosis, unspecified: Secondary | ICD-10-CM

## 2023-01-25 DIAGNOSIS — I152 Hypertension secondary to endocrine disorders: Secondary | ICD-10-CM

## 2023-01-25 DIAGNOSIS — Z6826 Body mass index (BMI) 26.0-26.9, adult: Secondary | ICD-10-CM

## 2023-01-25 DIAGNOSIS — E119 Type 2 diabetes mellitus without complications: Secondary | ICD-10-CM

## 2023-01-25 DIAGNOSIS — E785 Hyperlipidemia, unspecified: Secondary | ICD-10-CM

## 2023-01-25 DIAGNOSIS — N183 Chronic kidney disease, stage 3 unspecified: Secondary | ICD-10-CM

## 2023-01-25 LAB — COMPREHENSIVE METABOLIC PANEL
ALT: 20 IU/L (ref 0–32)
Albumin: 4.3 g/dL (ref 3.8–4.8)
BUN/Creatinine Ratio: 16 (ref 12–28)
eGFR: 36 mL/min/{1.73_m2} — ABNORMAL LOW (ref >59)

## 2023-01-25 LAB — LIPID PANEL
HDL: 68 mg/dL (ref >39)
Triglycerides: 118 mg/dL (ref 0–149)
VLDL Cholesterol Cal: 21 mg/dL (ref 5–40)

## 2023-01-25 MED ORDER — ASPIRIN 81 MG PO TBEC: 81.0000 mg | DELAYED_RELEASE_TABLET | Freq: Every day | ORAL | 3 refills | Status: DC

## 2023-01-25 NOTE — Patient Instructions (Signed)
Medication Instructions:  Your physician has recommended you make the following change in your medication:  1.) restart aspirin 81 mg (enteric coated EC)  *If you need a refill on your cardiac medications before your next appointment, please call your pharmacy*   Lab Work: Today: cmet, lipids, Lp(a)   Testing/Procedures: none   Follow-Up: At [facility], you and your health needs are our priority.  As part of our continuing mission to provide you with exceptional heart care, we have created designated Provider Care Teams.  These Care Teams include your primary Cardiologist (physician) and Advanced Practice Providers (APPs -  Physician Assistants and Nurse Practitioners) who all work together to provide you with the care you need, when you need it.   Your next appointment:   6 month(s)  Provider:   Advanced Practice Provider (NP or PA-C)  Other Instructions You have been referred to the Clinical Pharmacy Team

## 2023-01-26 ENCOUNTER — Telehealth: Payer: Self-pay | Admitting: *Deleted

## 2023-01-26 DIAGNOSIS — N183 Chronic kidney disease, stage 3 unspecified: Secondary | ICD-10-CM

## 2023-01-26 DIAGNOSIS — I152 Hypertension secondary to endocrine disorders: Secondary | ICD-10-CM

## 2023-01-26 LAB — COMPREHENSIVE METABOLIC PANEL
AST: 30 IU/L (ref 0–40)
Alkaline Phosphatase: 101 IU/L (ref 44–121)
BUN: 24 mg/dL (ref 8–27)
Bilirubin Total: 0.4 mg/dL (ref 0.0–1.2)
CO2: 26 mmol/L (ref 20–29)
Calcium: 9.4 mg/dL (ref 8.7–10.3)
Chloride: 99 mmol/L (ref 96–106)
Creatinine, Ser: 1.49 mg/dL — ABNORMAL HIGH (ref 0.57–1.00)
Globulin, Total: 2.9 g/dL (ref 1.5–4.5)
Glucose: 134 mg/dL — ABNORMAL HIGH (ref 70–99)
Potassium: 3.6 mmol/L (ref 3.5–5.2)
Sodium: 141 mmol/L (ref 134–144)
Total Protein: 7.2 g/dL (ref 6.0–8.5)

## 2023-01-26 LAB — LIPID PANEL
Chol/HDL Ratio: 3.8 ratio (ref 0.0–4.4)
Cholesterol, Total: 256 mg/dL — ABNORMAL HIGH (ref 100–199)
LDL Chol Calc (NIH): 167 mg/dL — ABNORMAL HIGH (ref 0–99)

## 2023-01-26 LAB — LIPOPROTEIN A (LPA): Lipoprotein (a): 337 nmol/L — ABNORMAL HIGH (ref <75.0)

## 2023-01-26 MED ORDER — LISINOPRIL 5 MG PO TABS: 5.0000 mg | ORAL_TABLET | Freq: Every day | ORAL | 3 refills | Status: DC

## 2023-01-26 NOTE — Telephone Encounter (Signed)
Spoke w the patient and reviewed result and recommendation.  She will pick up and restart lisinopril 5 mg daily and come in for bmet next Friday 02/03/23.

## 2023-01-26 NOTE — Telephone Encounter (Signed)
-----   Message from Orbie Pyo, MD sent at 01/25/2023  8:51 PM EDT ----- Start lisinopril 5mg  qday and BMP in one week

## 2023-02-03 ENCOUNTER — Ambulatory Visit: Payer: 59 | Attending: Internal Medicine

## 2023-02-03 DIAGNOSIS — N183 Chronic kidney disease, stage 3 unspecified: Secondary | ICD-10-CM

## 2023-02-03 DIAGNOSIS — I152 Hypertension secondary to endocrine disorders: Secondary | ICD-10-CM

## 2023-02-04 LAB — BASIC METABOLIC PANEL
BUN/Creatinine Ratio: 14 (ref 12–28)
BUN: 21 mg/dL (ref 8–27)
CO2: 27 mmol/L (ref 20–29)
Calcium: 9.1 mg/dL (ref 8.7–10.3)
Chloride: 96 mmol/L (ref 96–106)
Creatinine, Ser: 1.45 mg/dL — ABNORMAL HIGH (ref 0.57–1.00)
Glucose: 138 mg/dL — ABNORMAL HIGH (ref 70–99)
Potassium: 3.8 mmol/L (ref 3.5–5.2)
Sodium: 138 mmol/L (ref 134–144)
eGFR: 37 mL/min/{1.73_m2} — ABNORMAL LOW (ref >59)

## 2023-02-06 ENCOUNTER — Telehealth: Payer: Self-pay | Admitting: Internal Medicine

## 2023-02-06 DIAGNOSIS — N183 Chronic kidney disease, stage 3 unspecified: Secondary | ICD-10-CM

## 2023-02-06 DIAGNOSIS — E1159 Type 2 diabetes mellitus with other circulatory complications: Secondary | ICD-10-CM

## 2023-02-06 NOTE — Telephone Encounter (Signed)
Pt c/o medication issue:  1. Name of Medication: Lisinopril  2. How are you currently taking this medication (dosage and times per day)? 1 time daily  3. Are you having a reaction (difficulty breathing--STAT)?   4. What is your medication issue? Coughing, diarrhea, vomiting and soft bowel movements

## 2023-02-06 NOTE — Telephone Encounter (Signed)
Returned pts call. Pt stated she has had cough since starting medications. This past Thursday and Friday nights she started to have a cough that kept her up those nights. On Saturday (7/6) she became nauseated after taking her Lisinopril and later vomited and had diarrhea. She had N/V and diarrhea for two days. Pt states she has not taken her lisinopril this morning and feels better. Today is the first day she has not taken it. Pt states she has not been running a fever and has had no other symptoms other than a mild headache sometime last week. Blood pressures: 7/3 -130/80 , 7/4 - 130/76, 7/5 - 138/78, 7/6 - 135/79, 7/7 - 130/77, and today on the phone 140/78. She wanted me to add to my note she was drinking coffee this morning before I called her. Told her to continue drinking water to stay hydrated and I would pass this information on to Dr. Lynnette Caffey and his nurse for their advice.

## 2023-02-06 NOTE — Telephone Encounter (Signed)
-----   Message from Orbie Pyo, MD sent at 02/04/2023  8:43 AM EDT ----- [name] and make sure she is taking lisinopril

## 2023-02-06 NOTE — Telephone Encounter (Signed)
Patient called in this morning and stopped her lisinopril because she was having a reaction. SO just to clarify you want her to start losartan and jardiance?

## 2023-02-07 NOTE — Telephone Encounter (Signed)
BP today: 122/77 this morning.  Had not taken amlodipine yet. Asked her check BP a few hours after amlodipine, record and bring w her to PharmD visit on 71/9/24.

## 2023-02-17 ENCOUNTER — Ambulatory Visit: Payer: 59

## 2023-03-06 ENCOUNTER — Ambulatory Visit: Payer: 59

## 2023-03-07 ENCOUNTER — Ambulatory Visit: Payer: Medicare Other | Admitting: Neurology

## 2023-03-10 ENCOUNTER — Ambulatory Visit: Payer: Medicare Other | Admitting: Neurology

## 2023-03-11 ENCOUNTER — Other Ambulatory Visit: Payer: Self-pay | Admitting: Neurology

## 2023-03-20 ENCOUNTER — Ambulatory Visit (INDEPENDENT_AMBULATORY_CARE_PROVIDER_SITE_OTHER): Payer: Medicare HMO | Admitting: Neurology

## 2023-03-20 ENCOUNTER — Encounter: Payer: Self-pay | Admitting: Neurology

## 2023-03-20 VITALS — BP 128/62 | HR 67 | Ht 65.0 in | Wt 154.0 lb

## 2023-03-20 DIAGNOSIS — M4802 Spinal stenosis, cervical region: Secondary | ICD-10-CM | POA: Diagnosis not present

## 2023-03-20 NOTE — Progress Notes (Signed)
Follow-up Visit   Date: 03/20/23   [name] MRN: [id number] DOB: [birth year]   Interim History: [name] is a 82 y.o. right-handed female with cervical canal stenosis s/p ACDF at C5-6 and hyperCKemia returning to the clinic for complaints of neck stiffness and pain.  The patient was accompanied to the clinic by self.  History of present illness: Starting in around June 2021, she began having left thigh muscle spasms and sensation that the muscle is moving under her skin.  She does not observe muscle twitching.  Mornings are especially difficult because of generalized stiffness in the legs, for example, she feels as if she could get stuck, if she tried to bend down.  Prolonged sitting, standing, and cold temperatures cause her legs to feel tight.  She does not have symptoms when active, such as walking.   She is able to climb stairs and get out of a chair without difficulty.  No falls, dark colored urine.  She saw her PCP where CK level was found to be elevated [phone]. She was referred to rheumatology whose evaluation was negative.   Symptoms have improved over the past month, occurring less frequently. No medication changes or preceding illness. She is not on statin therapy.  COVID vaccine received in Jan/Feb 2021.   UPDATE 02/18/2021:  She reports improved muscle pain and stiffness since doing physical therapy. She still has some soreness at the proximal leg with squatting and standing, but overall her pain has significantly improved.   She is compliant with home exercises. No new complaints.   UPDATE 03/09/2022: She is here for follow-up.  Muscle spasms and cramps are stable and still significantly improved from when she was initially seen in 2021.  She continues to have painful muscle cramps, which is triggered by prolonged standing or sitting. When her cramps occur, she has difficulty moving the muscles because of stiffness and pain.   If she remains active and walks, her  symptoms are well-controlled.  No new weakness.    UPDATE 12/13/2022:  Around December, she received two injections of Repatha and developed severe muscle pain similar to what she experienced before.  Symptoms started a few minutes after administering the injection.  She also has head congestion and fluid in her ear. Fortunately, muscle pain has significantly improved.    A month ago, she began having stiffness in her neck and reduced range of motion.  She has tried ibuprofen which helps.  No numbness/tingling. She had known cervical canal stenosis at C4-5 from prior MRI in 2021.   UPDATE 03/20/2023:  She is here for follow-up visit.  She had updated MRI cervical spine due to progressive neck pain and stiffness at her last visit.  Imaging shows stable adjacent disease at C4-5 with moderate central canal stenosis.  She completed neck PT which significantly helped her pain.  She is doing well today.  No muscle pain or cramps.   Medications:  Current Outpatient Medications on File Prior to Visit  Medication Sig Dispense Refill   amLODipine (NORVASC) 10 MG tablet Take 10 mg by mouth daily.     chlorthalidone (HYGROTON) 25 MG tablet Take 25 mg by mouth daily.     ketorolac (ACULAR) 0.5 % ophthalmic solution Place 1 drop into the left eye 4 (four) times daily.     metformin (FORTAMET) 1000 MG (OSM) 24 hr tablet Take 1,000 mg by mouth daily with breakfast.     No current facility-administered medications on file prior  to visit.    Allergies:  Allergies  Allergen Reactions   Praluent [Alirocumab] Other (See Comments)   Centrum Silver [Actical]     Rash    Cozaar [Losartan]     Cough, muscle aches, and stomach cramps   Crestor [Rosuvastatin]     NVD   Lipitor [Atorvastatin]     Rash    Lisinopril Cough    Cough, Diarrhea, Nausea, Vomiting   Protonix [Pantoprazole]     Back pain   Repatha [Evolocumab]     Muscle Pain, Flu like symptoms    Simvastatin     Rash    Sulfa Antibiotics      Unsure of side effects   Zetia [Ezetimibe]     Nausea   Multivitamin+ [Multiple Vitamin] Rash    Vital Signs:  BP (!) 163/72   Pulse 67   Ht 5\' 5"  (1.651 m)   Wt 154 lb (69.9 kg)   SpO2 99%   BMI 25.63 kg/m   Neurological Exam: MENTAL STATUS including orientation to time, place, person, recent and remote memory, attention span and concentration, language, and fund of knowledge is normal.  Speech is not dysarthric.   CRANIAL NERVES:   Pupils equal round and reactive to light.  Normal conjugate, extra-ocular eye movements in all directions of gaze.  No ptosis.  Face is symmetric.   MOTOR:  Motor strength is 5/5 in all extremities.  No atrophy, fasciculations or abnormal movements.  No pronator drift.  Tone is normal.    MSRs:  Reflexes are 3+/4 throughout, except absent at the ankles.  COORDINATION/GAIT:    Gait narrow based and stable.   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 There is no evidence of any diffuse myopathy or cervical/lumbosacral radiculopathy affecting the right side.  MRI cervical spine 05/10/2020: 1. Prior ACDF at C5-6 without residual or recurrent stenosis. 2. Adjacent segment disease with progressive 3 mm retrolisthesis of C4 on C5 with resultant moderate spinal stenosis, with moderate to severe left worse than right C5 foraminal stenosis. 3. Left eccentric disc osteophyte at C3-4 with resultant mild canal and bilateral C4 foraminal stenosis.   Labs 11/19/2020:  4*, vitamin B12 389, CK 709*, CRP 1.7, folate 3.8, SPEP with IFE - polyclonal increase in immunoglobulins, GAD65 negative  Lab Results  Component Value Date   ESRSEDRATE 94 (H) 11/19/2020   Lab Results  Component Value Date   VITAMINB12 389 11/19/2020   Lab Results  Component Value Date   CKTOTAL 709 (H) 11/19/2020   MRI cervical spine wo contrast 01/11/2023: 1.  Prior ACDF at C5-6 without residual or recurrent stenosis. 2. Adjacent segment disease at C4-5 with resultant moderate spinal stenosis, with moderate left worse than right C5 foraminal narrowing. 3. Left eccentric disc osteophyte and facet hypertrophy at C3-4 with resultant moderate left C4 foraminal stenosis.    IMPRESSION/PLAN: Cervical canal stenosis s/p ACDF C5-6 (Dr. Lovell Sheehan) with adjacent segment stenosis at C4-5.  Neck pain improved with PT.   - Continue neck exercises at home  - Continue to use tizandine 2mg  at bedtime as needed  Return to clinic in 6 months   Thank you for allowing me to participate in patient's care.  If I can answer any additional questions, I would be pleased to do so.    Sincerely,    Vermell Madrid K. Allena Katz, DO

## 2023-03-20 NOTE — Patient Instructions (Signed)
It was great to see you today!  [address] [place] Ouch!  I will see you back in 6 months

## 2023-05-16 ENCOUNTER — Ambulatory Visit: Payer: 59 | Admitting: Internal Medicine

## 2023-09-20 ENCOUNTER — Ambulatory Visit: Payer: Medicare HMO | Admitting: Neurology

## 2023-09-26 ENCOUNTER — Encounter: Payer: Self-pay | Admitting: Neurology

## 2023-09-26 ENCOUNTER — Ambulatory Visit (INDEPENDENT_AMBULATORY_CARE_PROVIDER_SITE_OTHER): Payer: Medicare HMO | Admitting: Neurology

## 2023-09-26 VITALS — BP 145/77 | HR 103 | Ht 65.0 in | Wt 161.0 lb

## 2023-09-26 DIAGNOSIS — M4802 Spinal stenosis, cervical region: Secondary | ICD-10-CM

## 2023-09-26 DIAGNOSIS — H8110 Benign paroxysmal vertigo, unspecified ear: Secondary | ICD-10-CM

## 2023-09-26 NOTE — Patient Instructions (Signed)
 You may try meclizine for vertigo as needed

## 2023-09-26 NOTE — Progress Notes (Signed)
 Follow-up Visit   Date: 09/26/23   [name] MRN: [id number] DOB: Apr 25, 1941   Interim History: [name] is a 83 y.o. right-handed female with cervical canal stenosis s/p ACDF at C5-6 and hyperCKemia returning to the clinic for complaints of neck stiffness and vertigo.  The patient was accompanied to the clinic by self.  History of present illness: Starting in around June 2021, she began having left thigh muscle spasms and sensation that the muscle is moving under her skin.  She does not observe muscle twitching.  Mornings are especially difficult because of generalized stiffness in the legs, for example, she feels as if she could get stuck, if she tried to bend down.  Prolonged sitting, standing, and cold temperatures cause her legs to feel tight.  She does not have symptoms when active, such as walking.   She is able to climb stairs and get out of a chair without difficulty.  No falls, dark colored urine.  She saw her PCP where CK level was found to be elevated [id number]. She was referred to rheumatology whose evaluation was negative.   Symptoms have improved over the past month, occurring less frequently. No medication changes or preceding illness. She is not on statin therapy.  COVID vaccine received in Jan/Feb 2021.   UPDATE 02/18/2021:  She reports improved muscle pain and stiffness since doing physical therapy. She still has some soreness at the proximal leg with squatting and standing, but overall her pain has significantly improved.   She is compliant with home exercises. No new complaints.   UPDATE 03/09/2022: She is here for follow-up.  Muscle spasms and cramps are stable and still significantly improved from when she was initially seen in 2021.  She continues to have painful muscle cramps, which is triggered by prolonged standing or sitting. When her cramps occur, she has difficulty moving the muscles because of stiffness and pain.   If she remains active and walks, her  symptoms are well-controlled.  No new weakness.    UPDATE 12/13/2022:  Around December, she received two injections of Repatha and developed severe muscle pain similar to what she experienced before.  Symptoms started a few minutes after administering the injection.  She also has head congestion and fluid in her ear. Fortunately, muscle pain has significantly improved.    A month ago, she began having stiffness in her neck and reduced range of motion.  She has tried ibuprofen which helps.  No numbness/tingling. She had known cervical canal stenosis at C4-5 from prior MRI in 2021.   UPDATE 03/20/2023:  She is here for follow-up visit.  She had updated MRI cervical spine due to progressive neck pain and stiffness at her last visit.  Imaging shows stable adjacent disease at C4-5 with moderate central canal stenosis.  She completed neck PT which significantly helped her pain.  She is doing well today.  No muscle pain or cramps.   UPDATE 09/26/2023:  Last month, she began having spells of vertigo with room-spinning.  No associated nausea or imbalance. Symptoms are improving.  She noticed it more with positional changes.  Her neck pain has overall improved.  She does not take muscle relaxer any longer or take any OTC medications.  She has some soreness and has relief with massage.    Medications:  Current Outpatient Medications on File Prior to Visit  Medication Sig Dispense Refill   amLODipine (NORVASC) 10 MG tablet Take 10 mg by mouth daily.  chlorthalidone (HYGROTON) 25 MG tablet Take 25 mg by mouth daily.     ketorolac (ACULAR) 0.5 % ophthalmic solution Place 1 drop into the left eye daily.     metformin (FORTAMET) 1000 MG (OSM) 24 hr tablet Take 1,000 mg by mouth daily with breakfast.     No current facility-administered medications on file prior to visit.    Allergies:  Allergies  Allergen Reactions   Praluent [Alirocumab] Other (See Comments)   Centrum Silver [Actical]     Rash    Cozaar  [Losartan]     Cough, muscle aches, and stomach cramps   Crestor [Rosuvastatin]     NVD   Lipitor [Atorvastatin]     Rash    Lisinopril Cough    Cough, Diarrhea, Nausea, Vomiting   Protonix [Pantoprazole]     Back pain   Repatha [Evolocumab]     Muscle Pain, Flu like symptoms    Simvastatin     Rash    Sulfa Antibiotics     Unsure of side effects   Zetia [Ezetimibe]     Nausea   Multivitamin+ [Multiple Vitamin] Rash    Vital Signs:  BP (!) 145/77   Pulse (!) 103   Ht 5\' 5"  (1.651 m)   Wt 161 lb (73 kg)   SpO2 98%   BMI 26.79 kg/m   Neurological Exam: MENTAL STATUS including orientation to time, place, person, recent and remote memory, attention span and concentration, language, and fund of knowledge is normal.  Speech is not dysarthric.   CRANIAL NERVES:   Pupils equal round and reactive to light.  Normal conjugate, extra-ocular eye movements in all directions of gaze.  No ptosis. Mild nystagmus at left end gaze.  Face is symmetric.   MOTOR:  Motor strength is 5/5 in all extremities.  No atrophy, fasciculations or abnormal movements.  No pronator drift.  Tone is normal.    MSRs:  Reflexes are 3+/4 throughout, except absent at the ankles.  COORDINATION/GAIT:    Gait narrow based and stable.   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 There is no evidence of any diffuse myopathy or cervical/lumbosacral radiculopathy affecting the right side.  MRI cervical spine 05/10/2020: 1. Prior ACDF at C5-6 without residual or recurrent stenosis. 2. Adjacent segment disease with progressive 3 mm retrolisthesis of C4 on C5 with resultant moderate spinal stenosis, with moderate to severe left worse than right C5 foraminal stenosis. 3. Left eccentric disc osteophyte at C3-4 with resultant mild canal and bilateral C4 foraminal stenosis.   Labs 11/19/2020:    vitamin B12 389, CK 709*, CRP 1.7, folate 3.8, SPEP with IFE - polyclonal increase in immunoglobulins, GAD65 negative  Lab Results  Component Value Date   ESRSEDRATE 94 (H) 11/19/2020   Lab Results  Component Value Date   VITAMINB12 389 11/19/2020   Lab Results  Component Value Date   CKTOTAL 709 (H) 11/19/2020   MRI cervical spine wo contrast 01/11/2023: 1. Prior ACDF at C5-6 without residual or recurrent stenosis. 2. Adjacent segment disease at C4-5 with resultant moderate spinal stenosis, with moderate left worse than right C5 foraminal narrowing. 3. Left eccentric disc osteophyte and facet hypertrophy at C3-4 with resultant moderate left C4 foraminal stenosis.    IMPRESSION/PLAN: Cervical canal stenosis s/p ACDF C5-6 (by Dr. Lovell Sheehan) with adjacent disease at C4-5, asymptomatic.  She has completed PT and was taking tizanidine.  Benign paroxsymal positional vertigo, slowly improving. She may use meclizine as needed.  If symptoms get worse, refer to vestibular therapy.   Return to clinic 9 months   Thank you for allowing me to participate in patient's care.  If I can answer any additional questions, I would be pleased to do so.    Sincerely,    Lillyian Heidt K. Allena Katz, DO

## 2023-12-26 ENCOUNTER — Telehealth: Payer: Self-pay | Admitting: Neurology

## 2023-12-26 NOTE — Telephone Encounter (Signed)
 Called and spoke to patient and she informed me that she has been noticing for the past several weeks that when she turns her head to the side everything looks like it is going downhill. Patient has history of vertigo. Patient was offered and scheduled for an appointment tomorrow at 8:50 am with Dr. Lydia Sams.

## 2023-12-26 NOTE — Telephone Encounter (Signed)
 Patient states that she Is not sure if she needs to see Dr patel or her Eye dr  she states that when she is sitting down and turns her head to the side everything looks like it is going down hill also happens while standing

## 2023-12-27 ENCOUNTER — Ambulatory Visit (INDEPENDENT_AMBULATORY_CARE_PROVIDER_SITE_OTHER): Admitting: Neurology

## 2023-12-27 ENCOUNTER — Encounter: Payer: Self-pay | Admitting: Neurology

## 2023-12-27 VITALS — BP 139/73 | HR 103 | Ht 65.0 in | Wt 151.0 lb

## 2023-12-27 DIAGNOSIS — R42 Dizziness and giddiness: Secondary | ICD-10-CM | POA: Diagnosis not present

## 2023-12-27 DIAGNOSIS — R519 Headache, unspecified: Secondary | ICD-10-CM | POA: Diagnosis not present

## 2023-12-27 DIAGNOSIS — H539 Unspecified visual disturbance: Secondary | ICD-10-CM | POA: Diagnosis not present

## 2023-12-27 NOTE — Progress Notes (Signed)
 Follow-up Visit   Date: 12/27/23   [name] MRN: [id number] DOB: 1941-04-15   Interim History: [name] is a 83 y.o. right-handed female with cervical canal stenosis s/p ACDF at C5-6 and hyperCKemia returning to the clinic for complaints of neck stiffness and vertigo.  The patient was accompanied to the clinic by self.  IMPRESSION/PLAN: This is a pleasant 83 year old female with history of BPPV presenting for acute evaluation of altered depth perception.  She describes symptoms as if images on the lateral sides of her vision are lower than the horizon, as if there was a slope and the ground was lower.  This only occurs when she looks down and to the right or when neck is extended and she looks down and to the left.  Visual distortion only involves the lateral visual field and quickly resolved with looking straight again.  Symptoms started around the same time as having a viral illness causing right ear pain, drainage, and facial pain.  Her facial pain and ear symptoms have lessened intensity, but not completely resolved.  I am not sure what is causing her symptoms. She has mild lateral gaze nystagmus on the right, otherwise cranial nerve exam is normal. She has hyperreflexia due to known cervical canal stenosis at C4-5, but this would not cause her current symptoms. I will check MRI/A brain to be sure there is not structural abnormality to affect her vision.  With her symptoms starting with ear pain and triggered by eye movement, I will get the opinion of ENT, otherwise she may need to see ophthalmology.   ---------------------------------------------  History of present illness: Starting in around June 2021, she began having left thigh muscle spasms and sensation that the muscle is moving under her skin.  She does not observe muscle twitching.  Mornings are especially difficult because of generalized stiffness in the legs, for example, she feels as if she could get stuck, if  she tried to bend down.  Prolonged sitting, standing, and cold temperatures cause her legs to feel tight.  She does not have symptoms when active, such as walking.   She is able to climb stairs and get out of a chair without difficulty.  No falls, dark colored urine.  She saw her PCP where CK level was found to be elevated [id number]. She was referred to rheumatology whose evaluation was negative.   Symptoms have improved over the past month, occurring less frequently. No medication changes or preceding illness. She is not on statin therapy.  COVID vaccine received in Jan/Feb 2021.   UPDATE 02/18/2021:  She reports improved muscle pain and stiffness since doing physical therapy. She still has some soreness at the proximal leg with squatting and standing, but overall her pain has significantly improved.   She is compliant with home exercises. No new complaints.   UPDATE 03/09/2022: She is here for follow-up.  Muscle spasms and cramps are stable and still significantly improved from when she was initially seen in 2021.  She continues to have painful muscle cramps, which is triggered by prolonged standing or sitting. When her cramps occur, she has difficulty moving the muscles because of stiffness and pain.   If she remains active and walks, her symptoms are well-controlled.  No new weakness.    UPDATE 12/13/2022:  Around December, she received two injections of Repatha and developed severe muscle pain similar to what she experienced before.  Symptoms started a few minutes after administering the injection.  She also  has head congestion and fluid in her ear. Fortunately, muscle pain has significantly improved.    A month ago, she began having stiffness in her neck and reduced range of motion.  She has tried ibuprofen which helps.  No numbness/tingling. She had known cervical canal stenosis at C4-5 from prior MRI in 2021.   UPDATE 03/20/2023:  She is here for follow-up visit.  She had updated MRI cervical spine due  to progressive neck pain and stiffness at her last visit.  Imaging shows stable adjacent disease at C4-5 with moderate central canal stenosis.  She completed neck PT which significantly helped her pain.  She is doing well today.  No muscle pain or cramps.   UPDATE 09/26/2023:  Last month, she began having spells of vertigo with room-spinning.  No associated nausea or imbalance. Symptoms are improving.  She noticed it more with positional changes.  Her neck pain has overall improved.  She does not take muscle relaxer any longer or take any OTC medications.  She has some soreness and has relief with massage.    UPDATE 12/27/2023:  She is here for acute visit.  About a month ago, she was having right ear pain and drainage, which associated pain over the right side of the head.  She saw her PCP who suggested NSAIDs which resolved her pain.  During the same time, she started noticing that if she looks down and right, then the images in her field of vision appear much lower, as if it was on a bank. It only occurs on the left side, if she extends her neck and then looks down and out.   She denies spinning or nausea or vomiting.  Symptoms resolve if she looks straight again.  She denies double vision, facial numbness/tingling, speech changes, or limb weakness.    Medications:  Current Outpatient Medications on File Prior to Visit  Medication Sig Dispense Refill   amLODipine (NORVASC) 10 MG tablet Take 10 mg by mouth daily.     chlorthalidone (HYGROTON) 25 MG tablet Take 25 mg by mouth daily.     ketorolac (ACULAR) 0.5 % ophthalmic solution Place 1 drop into the left eye daily.     metformin (FORTAMET) 1000 MG (OSM) 24 hr tablet Take 1,000 mg by mouth daily with breakfast.     No current facility-administered medications on file prior to visit.    Allergies:  Allergies  Allergen Reactions   Praluent [Alirocumab] Other (See Comments)   Centrum Silver [Actical]     Rash    Cozaar [Losartan]     Cough,  muscle aches, and stomach cramps   Crestor [Rosuvastatin]     NVD   Lipitor [Atorvastatin]     Rash    Lisinopril  Cough    Cough, Diarrhea, Nausea, Vomiting   Protonix [Pantoprazole]     Back pain   Repatha [Evolocumab]     Muscle Pain, Flu like symptoms    Simvastatin     Rash    Sulfa Antibiotics     Unsure of side effects   Zetia [Ezetimibe]     Nausea   Multivitamin+ [Multiple Vitamin] Rash    Vital Signs:  BP 139/73 (BP Location: Left Arm, Patient Position: Sitting)   Pulse (!) 103   Ht 5\' 5"  (1.651 m)   Wt 151 lb (68.5 kg)   SpO2 99%   BMI 25.13 kg/m   Neurological Exam: MENTAL STATUS including orientation to time, place, person, recent and remote memory, attention span  and concentration, language, and fund of knowledge is normal.  Speech is not dysarthric.   CRANIAL NERVES:   Pupils equal round and reactive to light.  Normal conjugate, extra-ocular eye movements in all directions of gaze.  No ptosis. Mild nystagmus at right end gaze.  Face is symmetric.   MOTOR:  Motor strength is 5/5 in all extremities.  No atrophy, fasciculations or abnormal movements.  No pronator drift.  Tone is normal.    MSRs:  Reflexes are 3+/4 throughout, except absent at the ankles.  COORDINATION/GAIT:    Gait narrow based and stable.   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 There is no evidence of any diffuse myopathy or cervical/lumbosacral radiculopathy affecting the right side.  MRI cervical spine 05/10/2020: 1. Prior ACDF at C5-6 without residual or recurrent stenosis. 2. Adjacent segment disease with progressive 3 mm retrolisthesis of C4 on C5 with resultant moderate spinal stenosis, with moderate to severe left worse than right C5 foraminal stenosis. 3. Left eccentric disc osteophyte at C3-4 with resultant mild canal and bilateral C4 foraminal  stenosis.   Labs 11/19/2020:   vitamin B12 389, CK 709*, CRP 1.7, folate 3.8, SPEP with IFE - polyclonal increase in immunoglobulins, GAD65 negative  Lab Results  Component Value Date   ESRSEDRATE 94 (H) 11/19/2020   Lab Results  Component Value Date   VITAMINB12 389 11/19/2020   Lab Results  Component Value Date   CKTOTAL 709 (H) 11/19/2020   MRI cervical spine wo contrast 01/11/2023: 1. Prior ACDF at C5-6 without residual or recurrent stenosis. 2. Adjacent segment disease at C4-5 with resultant moderate spinal stenosis, with moderate left worse than right C5 foraminal narrowing. 3. Left eccentric disc osteophyte and facet hypertrophy at C3-4 with resultant moderate left C4 foraminal stenosis.   Total time spent reviewing records, interview, history/exam, documentation, and coordination of care on day of encounter:  40 minutes    Thank you for allowing me to participate in patient's care.  If I can answer any additional questions, I would be pleased to do so.    Sincerely,    Annaya Bangert K. Lydia Sams, DO

## 2023-12-29 ENCOUNTER — Telehealth: Payer: Self-pay

## 2023-12-29 NOTE — Telephone Encounter (Signed)
-----   Message from Daryel Ensign sent at 12/29/2023 10:18 AM EDT ----- Can you let pt know that I would like for her to see her ophthalmologist, as ENT did not really address any of her visual symptoms.  Thank you. ----- Message ----- From: Oda Bence Sent: 12/28/2023   1:54 PM EDT To: Donika K Patel, DO

## 2023-12-29 NOTE — Telephone Encounter (Signed)
 Called patient and informed her of Dr. Basilio Both recommendations. Patient verbalized understanding and will contact her ophthalmologist.

## 2024-01-01 ENCOUNTER — Encounter: Payer: Self-pay | Admitting: Neurology

## 2024-01-06 ENCOUNTER — Ambulatory Visit
Admission: RE | Admit: 2024-01-06 | Discharge: 2024-01-06 | Disposition: A | Discharge status: Home / Self Care | Source: Ambulatory Visit | Attending: Neurology | Admitting: Neurology

## 2024-01-06 DIAGNOSIS — R519 Headache, unspecified: Secondary | ICD-10-CM

## 2024-01-06 DIAGNOSIS — H539 Unspecified visual disturbance: Secondary | ICD-10-CM

## 2024-01-06 DIAGNOSIS — R42 Dizziness and giddiness: Secondary | ICD-10-CM

## 2024-01-19 ENCOUNTER — Ambulatory Visit: Payer: Self-pay | Admitting: Neurology

## 2024-06-25 ENCOUNTER — Ambulatory Visit (INDEPENDENT_AMBULATORY_CARE_PROVIDER_SITE_OTHER): Payer: Medicare HMO | Admitting: Neurology

## 2024-06-25 ENCOUNTER — Encounter: Payer: Self-pay | Admitting: Neurology

## 2024-06-25 VITALS — BP 146/78 | HR 84 | Ht 65.0 in | Wt 156.0 lb

## 2024-06-25 DIAGNOSIS — H539 Unspecified visual disturbance: Secondary | ICD-10-CM

## 2024-06-25 DIAGNOSIS — M542 Cervicalgia: Secondary | ICD-10-CM

## 2024-06-25 DIAGNOSIS — M4802 Spinal stenosis, cervical region: Secondary | ICD-10-CM

## 2024-06-25 NOTE — Progress Notes (Signed)
 Follow-up Visit   Date: 06/25/24   [name] MRN: [id number] DOB: 12-Jan-1941   Interim History: [name] is a 83 y.o. right-handed female with cervical canal stenosis s/p ACDF at C5-6 and hyperCKemia returning to the clinic for complaints of neck stiffness and vertigo.  The patient was accompanied to the clinic by self.  IMPRESSION/PLAN: Assessment & Plan  Cervical spinal stenosis with associated cervical muscle spasm and strain Moderate stenosis at C4-C5 with muscle spasm and strain causing neck soreness, tightness, and stiffness, predominantly on the right. Previous physical therapy was beneficial. Muscle relaxers declined due to drowsiness risk. Biofreeze provides temporary relief. - Referred to physical therapy for cervical muscle spasm and strain. - Advised use of Biofreeze for symptomatic relief.  2.  Benign paroxysmal positional vertigo.  Intermittent vertigo with positional changes, resolving spontaneously.  - Continue to monitor symptoms and manage conservatively.  3.  Unexplained episodic visual disturbance.  MRI/A brain is unremarkable.  Persistent visual disturbance with no identified cause after extensive evaluation.   4.  HyperCKemia, benign.  Extensive evaluation did not reveal myopathy.  She has no weakness on exam and symptoms are stable.  Continue to monitor.  Return to clinic in 6 months  ---------------------------------------------  History of present illness:  UPDATE 06/25/2024:   Discussed the use of AI scribe software for clinical note transcription with the patient, who gave verbal consent to proceed.  History of Present Illness [name] is an 83 year old female who presents with persistent vision changes and neck pain.  She experiences ongoing vision changes, describing the sensation as looking off to the side with objects appearing lower when she looks laterally. Despite consultations with an eye doctor, retina specialist,  and undergoing MRI and CT scans, no abnormalities have been identified. The vision change is less severe than initially but persists. No current headaches, though she had right-sided headaches when the vision changes began.  She reports neck pain, particularly on the right side, described as sore and tight. Muscle spasms and stiffness in her arms are exacerbated by movement. Biofreeze provides temporary relief, but tightness persists. She has not tried heat or muscle relaxers for the neck pain.  She experiences vertigo when changing positions, especially when lying on her left side or back and turning to her right side. The vertigo is brief and resolves spontaneously.  She lives alone and has adjusted her activities to be more cautious.   Medications:  Current Outpatient Medications on File Prior to Visit  Medication Sig Dispense Refill   amLODipine (NORVASC) 10 MG tablet Take 10 mg by mouth daily.     chlorthalidone (HYGROTON) 25 MG tablet Take 25 mg by mouth daily.     ketorolac (ACULAR) 0.5 % ophthalmic solution Place 1 drop into the left eye daily.     metformin (FORTAMET) 1000 MG (OSM) 24 hr tablet Take 1,000 mg by mouth daily with breakfast.     No current facility-administered medications on file prior to visit.    Allergies:  Allergies  Allergen Reactions   Praluent [Alirocumab] Other (See Comments)   Centrum Silver [Actical]     Rash    Cozaar [Losartan]     Cough, muscle aches, and stomach cramps   Crestor [Rosuvastatin]     NVD   Lipitor [Atorvastatin]     Rash    Lisinopril  Cough    Cough, Diarrhea, Nausea, Vomiting   Protonix [Pantoprazole]     Back pain  Repatha [Evolocumab]     Muscle Pain, Flu like symptoms    Simvastatin     Rash    Sulfa Antibiotics     Unsure of side effects   Zetia [Ezetimibe]     Nausea   Multivitamin+ [Multiple Vitamin] Rash    Vital Signs:  BP (!) 158/75   Pulse 84   Ht 5' 5 (1.651 m)   Wt 156 lb (70.8 kg)   SpO2 99%    BMI 25.96 kg/m   Neurological Exam: MENTAL STATUS including orientation to time, place, person, recent and remote memory, attention span and concentration, language, and fund of knowledge is normal.  Speech is not dysarthric.   CRANIAL NERVES:   Pupils equal round and reactive to light.  Normal conjugate, extra-ocular eye movements in all directions of gaze.  No ptosis. Mild nystagmus at right end gaze.  Face is symmetric.   MOTOR:  Motor strength is 5/5 in all extremities.  No atrophy, fasciculations or abnormal movements.  No pronator drift.  Tone is normal.    MSRs:  Reflexes are 3+/4 throughout, except absent at the ankles.  COORDINATION/GAIT:    Gait narrow based and stable.   Data: MRI/A brain 01/18/2024: 1. No acute intracranial abnormality. 2. Mild chronic small vessel ischemic disease. 3. No large vessel occlusion or significant proximal intracranial arterial stenosis. 4. Chronic fluid in the petrous apex which could reflect a complex effusion or cholesterol granuloma.  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 There is no evidence of any diffuse myopathy or cervical/lumbosacral radiculopathy affecting the right side.  MRI cervical spine 05/10/2020: 1. Prior ACDF at C5-6 without residual or recurrent stenosis. 2. Adjacent segment disease with progressive 3 mm retrolisthesis of C4 on C5 with resultant moderate spinal stenosis, with moderate to severe left worse than right C5 foraminal stenosis. 3. Left eccentric disc osteophyte at C3-4 with resultant mild canal and bilateral C4 foraminal stenosis.   Labs 11/19/2020:   vitamin B12 389, CK 709*, CRP 1.7, folate 3.8, SPEP with IFE - polyclonal increase in immunoglobulins, GAD65 negative  Lab Results  Component Value Date   ESRSEDRATE 94 (H) 11/19/2020   Lab Results  Component Value Date   VITAMINB12  389 11/19/2020   Lab Results  Component Value Date   CKTOTAL 709 (H) 11/19/2020   MRI cervical spine wo contrast 01/11/2023: 1. Prior ACDF at C5-6 without residual or recurrent stenosis. 2. Adjacent segment disease at C4-5 with resultant moderate spinal stenosis, with moderate left worse than right C5 foraminal narrowing. 3. Left eccentric disc osteophyte and facet hypertrophy at C3-4 with resultant moderate left C4 foraminal stenosis.    Thank you for allowing me to participate in patient's care.  If I can answer any additional questions, I would be pleased to do so.    Sincerely,    Kili Gracy K. Tobie, DO

## 2024-06-25 NOTE — Patient Instructions (Signed)
 Referral placed for neck physiotherapy  You can try applying heat/ice, as needed

## 2024-09-05 ENCOUNTER — Ambulatory Visit (HOSPITAL_COMMUNITY): Admission: RE | Admit: 2024-09-05 | Discharge: 2024-09-05 | Attending: Surgery | Admitting: Surgery

## 2024-09-05 ENCOUNTER — Other Ambulatory Visit (HOSPITAL_COMMUNITY): Payer: Self-pay | Admitting: Internal Medicine

## 2024-09-05 DIAGNOSIS — R609 Edema, unspecified: Secondary | ICD-10-CM

## 2024-12-24 ENCOUNTER — Ambulatory Visit: Admitting: Neurology
